# Patient Record
Sex: Female | Born: 1995 | Race: Black or African American | Hispanic: No | Marital: Single | Smoking: Never smoker
Health system: Southern US, Community
[De-identification: ages and names within clinical notes are randomized; demographics above are authoritative.]

## PROBLEM LIST (undated history)

## (undated) DIAGNOSIS — J020 Streptococcal pharyngitis: Secondary | ICD-10-CM

## (undated) DIAGNOSIS — Z91018 Allergy to other foods: Secondary | ICD-10-CM

## (undated) HISTORY — DX: Allergy to other foods: Z91.018

## (undated) HISTORY — PX: WISDOM TOOTH EXTRACTION: SHX21

---
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
## 2014-11-01 ENCOUNTER — Emergency Department (HOSPITAL_COMMUNITY)
Admission: EM | Admit: 2014-11-01 | Discharge: 2014-11-01 | Discharge status: Absent without leave | Payer: No Typology Code available for payment source

## 2015-01-09 ENCOUNTER — Emergency Department (HOSPITAL_COMMUNITY)
Admission: EM | Admit: 2015-01-09 | Discharge: 2015-01-09 | Disposition: A | Discharge status: Home / Self Care | Payer: Medicaid Other | Attending: Emergency Medicine | Admitting: Emergency Medicine

## 2015-01-09 ENCOUNTER — Encounter (HOSPITAL_COMMUNITY): Payer: Self-pay | Admitting: *Deleted

## 2015-01-09 DIAGNOSIS — Z8709 Personal history of other diseases of the respiratory system: Secondary | ICD-10-CM | POA: Diagnosis not present

## 2015-01-09 DIAGNOSIS — N390 Urinary tract infection, site not specified: Secondary | ICD-10-CM | POA: Diagnosis not present

## 2015-01-09 DIAGNOSIS — Z3202 Encounter for pregnancy test, result negative: Secondary | ICD-10-CM | POA: Insufficient documentation

## 2015-01-09 DIAGNOSIS — R1030 Lower abdominal pain, unspecified: Secondary | ICD-10-CM | POA: Diagnosis present

## 2015-01-09 LAB — URINE MICROSCOPIC-ADD ON

## 2015-01-09 LAB — URINALYSIS, ROUTINE W REFLEX MICROSCOPIC
Bilirubin Urine: NEGATIVE
Glucose, UA: NEGATIVE mg/dL
Ketones, ur: 15 mg/dL — AB
Nitrite: NEGATIVE
Protein, ur: NEGATIVE mg/dL
Specific Gravity, Urine: >1.03 — ABNORMAL HIGH (ref 1.005–1.030)
pH: 5.5 (ref 5.0–8.0)

## 2015-01-09 LAB — PREGNANCY, URINE: Preg Test, Ur: NEGATIVE

## 2015-01-09 MED ORDER — CEPHALEXIN 500 MG PO CAPS
500.0000 mg | ORAL_CAPSULE | Freq: Once | ORAL | Status: AC
  Administered 2015-01-09: 500 mg via ORAL
  Filled 2015-01-09: qty 1

## 2015-01-09 MED ORDER — CEPHALEXIN 500 MG PO CAPS: 500.0000 mg | ORAL_CAPSULE | Freq: Four times a day (QID) | ORAL | Status: DC

## 2015-01-09 MED ORDER — PHENAZOPYRIDINE HCL 100 MG PO TABS
200.0000 mg | ORAL_TABLET | Freq: Once | ORAL | Status: AC
  Administered 2015-01-09: 200 mg via ORAL
  Filled 2015-01-09: qty 2

## 2015-01-09 MED ORDER — PHENAZOPYRIDINE HCL 200 MG PO TABS: 200.0000 mg | ORAL_TABLET | Freq: Three times a day (TID) | ORAL | Status: DC | PRN

## 2015-01-09 NOTE — ED Provider Notes (Signed)
CSN: [id number]     Arrival date & time 01/09/15  0622 History   First MD Initiated Contact with Patient 01/09/15 [phone]     Chief Complaint  Patient presents with  . Abdominal Pain     (Consider location/radiation/quality/duration/timing/severity/associated sxs/prior Treatment) HPI Comments: Patient presents to the emergency department with complaints of lower abdominal pain since yesterday. She reports a dull ache in the area of her bladder that has been continuous but she has had significant pain with urination. Patient reports frequency and urgency but only passing small amounts of urine. She has not had any vaginal bleeding or discharge. There is no back pain or upper abdominal pain. She has not had any fever, nausea or vomiting.  Patient is a 19 y.o. female presenting with abdominal pain.  Abdominal Pain Associated symptoms: dysuria     Past Medical History  Diagnosis Date  . Strep throat    History reviewed. No pertinent past surgical history. No family history on file. Social History  Substance Use Topics  . Smoking status: Never Smoker   . Smokeless tobacco: None  . Alcohol Use: No   OB History    No data available     Review of Systems  Gastrointestinal: Positive for abdominal pain.  Genitourinary: Positive for dysuria, urgency, frequency and decreased urine volume.  All other systems reviewed and are negative.     Allergies  Peanuts  Home Medications   Prior to Admission medications   Medication Sig Start Date End Date Taking? Authorizing Provider  menthol-cetylpyridinium (CEPACOL) 3 MG lozenge Take 1 lozenge by mouth as needed for sore throat.    Historical Provider, MD  Pseudoephedrine-Acetaminophen (SINUS APAP NON-DROWSY PO) Take 2 tablets by mouth daily as needed. Patient has been taking off and on for a while as needed    Historical Provider, MD   BP 109/70 mmHg  Pulse 98  Temp(Src) 98 F (36.7 C) (Oral)  Resp 16  Ht 5\' 5"  (1.651 m)  Wt 130 lb  (58.968 kg)  BMI 21.63 kg/m2  SpO2 99%  LMP 12/15/2014 Physical Exam  Constitutional: She is oriented to person, place, and time. She appears well-developed and well-nourished. No distress.  HENT:  Head: Normocephalic and atraumatic.  Right Ear: Hearing normal.  Left Ear: Hearing normal.  Nose: Nose normal.  Mouth/Throat: Oropharynx is clear and moist and mucous membranes are normal.  Eyes: Conjunctivae and EOM are normal. Pupils are equal, round, and reactive to light.  Neck: Normal range of motion. Neck supple.  Cardiovascular: Regular rhythm, S1 normal and S2 normal.  Exam reveals no gallop and no friction rub.   No murmur heard. Pulmonary/Chest: Effort normal and breath sounds normal. No respiratory distress. She exhibits no tenderness.  Abdominal: Soft. Normal appearance and bowel sounds are normal. There is no hepatosplenomegaly. There is tenderness in the suprapubic area. There is no rebound, no guarding, no CVA tenderness, no tenderness at McBurney's point and negative Murphy's sign. No hernia.  Musculoskeletal: Normal range of motion.  Neurological: She is alert and oriented to person, place, and time. She has normal strength. No cranial nerve deficit or sensory deficit. Coordination normal. GCS eye subscore is 4. GCS verbal subscore is 5. GCS motor subscore is 6.  Skin: Skin is warm, dry and intact. No rash noted. No cyanosis.  Psychiatric: She has a normal mood and affect. Her speech is normal and behavior is normal. Thought content normal.  Nursing note and vitals reviewed.   ED Course  Procedures (including critical care time) Labs Review Labs Reviewed  URINALYSIS, ROUTINE W REFLEX MICROSCOPIC (NOT AT [facility]) - Abnormal; Notable for the following:    APPearance HAZY (*)    Specific Gravity, Urine >1.030 (*)    Hgb urine dipstick TRACE (*)    Ketones, ur 15 (*)    Leukocytes, UA SMALL (*)    All other components within normal limits  URINE MICROSCOPIC-ADD ON - Abnormal;  Notable for the following:    Squamous Epithelial / LPF TOO NUMEROUS TO COUNT (*)    Bacteria, UA MANY (*)    All other components within normal limits  PREGNANCY, URINE    Imaging Review No results found. I have personally reviewed and evaluated these images and lab results as part of my medical decision-making.   EKG Interpretation None      MDM   Final diagnoses:  None   urinary tract infection  Patient presents to the ER for evaluation of urinary frequency, urinary urgency, dysuria and lower abdominal pain since yesterday. She does not have any signs of pyelonephritis. Vital signs are normal. Urinalysis shows obvious infection. Patient will be treated for urinary tract infection with Keflex and Pyridium for symptomatic treatment. Return if symptoms worsen.    Gilda Crease, MD 01/09/15 [phone]

## 2015-01-09 NOTE — Discharge Instructions (Signed)

## 2015-01-09 NOTE — ED Notes (Signed)
Pt c/o lower abd pain and burning with urination that started yesterday,

## 2015-01-09 NOTE — ED Notes (Signed)
Pt states understanding of care given and follow up instructions 

## 2015-06-05 ENCOUNTER — Encounter (HOSPITAL_COMMUNITY): Payer: Self-pay | Admitting: Emergency Medicine

## 2015-06-05 ENCOUNTER — Emergency Department (HOSPITAL_COMMUNITY)
Admission: EM | Admit: 2015-06-05 | Discharge: 2015-06-05 | Disposition: A | Discharge status: Home / Self Care | Payer: No Typology Code available for payment source | Attending: Emergency Medicine | Admitting: Emergency Medicine

## 2015-06-05 DIAGNOSIS — M67449 Ganglion, unspecified hand: Secondary | ICD-10-CM

## 2015-06-05 DIAGNOSIS — L729 Follicular cyst of the skin and subcutaneous tissue, unspecified: Secondary | ICD-10-CM | POA: Insufficient documentation

## 2015-06-05 NOTE — Discharge Instructions (Signed)
We are unable to perform this excision in the Emergency department . Please follow up with a surgeon.  Excision of Skin Lesions Excision of a skin lesion refers to the removal of a section of skin by making small cuts (incisions) in the skin. This procedure may be done to remove a cancerous (malignant) or noncancerous (benign) growth on the skin. It is typically done to treat or prevent cancer or infection. It may also be done to improve cosmetic appearance. The procedure may be done to remove:  Cancerous growths, such as basal cell carcinoma, squamous cell carcinoma, or melanoma.  Noncancerous growths, such as a cyst or lipoma.  Growths, such as moles or skin tags, which may be removed for cosmetic reasons. Various excision or surgical techniques may be used depending on your condition, the location of the lesion, and your overall health. LET [facility] CARE PROVIDER KNOW ABOUT:  Any allergies you have.  All medicines you are taking, including vitamins, herbs, eye drops, creams, and over-the-counter medicines.  Previous problems you or members of your family have had with the use of anesthetics.  Any blood disorders you have.  Previous surgeries you have had.  Any medical conditions you have.  Whether you are pregnant or may be pregnant. RISKS AND COMPLICATIONS Generally, this is a safe procedure. However, problems may occur, including:  Bleeding.  Infection.  Scarring.  Recurrence of the cyst, lipoma, or cancer.  Changes in skin sensation or appearance, such as discoloration or swelling.  Reaction to the anesthetics.  Allergic reaction to surgical materials or ointments.  Damage to nerves, blood vessels, muscles, or other structures.  Continued pain. BEFORE THE PROCEDURE  Ask your health care provider about:  Changing or stopping your regular medicines. This is especially important if you are taking diabetes medicines or blood thinners.  Taking medicines such  as aspirin and ibuprofen. These medicines can thin your blood. Do not take these medicines before your procedure if your health care provider instructs you not to.  You may be asked to take certain medicines.  You may be asked to stop smoking.  You may have an exam or testing.  Plan to have someone take you home after the procedure.  Plan to have someone help you with activities during recovery. PROCEDURE  To reduce your risk of infection:  Your health care team will wash or sanitize their hands.  Your skin will be washed with soap.  You will be given a medicine to numb the area (local anesthetic).  One of the following excision techniques will be performed.  At the end of any of these procedures, antibiotic ointment will be applied as needed. Each of the following techniques may vary among health care providers and hospitals. Complete Surgical Excision The area of skin that needs to be removed will be marked with a pen. Using a small scalpel or scissors, the surgeon will gently cut around and under the lesion until it is completely removed. The lesion will be placed in a fluid and sent to the lab for examination. If necessary, bleeding will be controlled with a device that delivers heat (electrocautery). The edges of the wound may be stitched (sutured) together, and a bandage (dressing) will be applied. This procedure may be performed to treat a cancerous growth or a noncancerous cyst or lesion. Excision of a Cyst The surgeon will make an incision on the cyst. The entire cyst will be removed through the incision. The incision may be closed with sutures.  Shave Excision During shave excision, the surgeon will use a small blade or an electrically heated loop instrument to shave off the lesion. This may be done to remove a mole or a skin tag. The wound will usually be left to heal on its own without sutures. Punch Excision During punch excision, the surgeon will use a small tool that is  like a cookie cutter or a hole punch to cut a circle shape out of the skin. The outer edges of the skin will be sutured together. This may be done to remove a mole or a scar or to perform a biopsy of the lesion. Mohs Micrographic Surgery During Mohs micrographic surgery, layers of the lesion will be removed with a scalpel or a loop instrument and will be examined right away under a microscope. Layers will be removed until all of the abnormal or cancerous tissue has been removed. This procedure is minimally invasive, and it ensures the best cosmetic outcome. It involves the removal of as little normal tissue as possible. Mohs is usually done to treat skin cancer, such as basal cell carcinoma or squamous cell carcinoma, particularly on the face and ears. Depending on the size of the surgical wound, it may be sutured closed. AFTER THE PROCEDURE  Return to your normal activities as told by your health care provider.  Talk with your health care provider to discuss any test results, treatment options, and if necessary, the need for more tests.   This information is not intended to replace advice given to you by your health care provider. Make sure you discuss any questions you have with your health care provider.   Document Released: 03/24/2009 Document Revised: 09/18/2014 Document Reviewed: 02/13/2014 Elsevier Interactive Patient Education [employer]2016 [employer]

## 2015-06-05 NOTE — ED Notes (Signed)
Pt states her right middle finger has been swollen over the joint for about 3 weeks.  States it went down some, but has returned and is painful to touch.  Denies injury.

## 2015-06-05 NOTE — ED Provider Notes (Signed)
CSN: [id number]     Arrival date & time 06/05/15  0727 History   First MD Initiated Contact with Patient 06/05/15 [phone]     Chief Complaint  Patient presents with  . Finger Injury     (Consider location/radiation/quality/duration/timing/severity/associated sxs/prior Treatment) HPI  Christina Shah Is a [age] year old female who presents to the emergency department with chief complaint of mass on her right middle finger. She states that it has been present for some time. She denies severe pain, tenderness, heat or redness. She is unhappy with the cosmetic appearance. She states it does not affect her work or hand movement. She denies injury to the finger She has no other complaints at this time.  Past Medical History  Diagnosis Date  . Strep throat    History reviewed. No pertinent past surgical history. History reviewed. No pertinent family history. Social History  Substance Use Topics  . Smoking status: Never Smoker   . Smokeless tobacco: None  . Alcohol Use: No   OB History    No data available     Review of Systems  Constitutional: Negative for fever and chills.  Musculoskeletal: Negative for joint swelling.  Skin: Negative for rash.       Skin mass      Allergies  Peanuts  Home Medications   Prior to Admission medications   Medication Sig Start Date End Date Taking? Authorizing Provider  cephALEXin (KEFLEX) 500 MG capsule Take 1 capsule (500 mg total) by mouth 4 (four) times daily. 01/09/15   Gilda Creasehristopher J Pollina, MD  menthol-cetylpyridinium (CEPACOL) 3 MG lozenge Take 1 lozenge by mouth as needed for sore throat.    Historical Provider, MD  phenazopyridine (PYRIDIUM) 200 MG tablet Take 1 tablet (200 mg total) by mouth 3 (three) times daily as needed for pain. 01/09/15   Gilda Creasehristopher J Pollina, MD  Pseudoephedrine-Acetaminophen (SINUS APAP NON-DROWSY PO) Take 2 tablets by mouth daily as needed. Patient has been taking off and on for a while as needed    Historical  Provider, MD   BP 101/67 mmHg  Pulse 82  Temp(Src) 98.4 F (36.9 C) (Oral)  Resp 16  Ht 5\' 5"  (1.651 m)  Wt 61.236 kg  BMI 22.47 kg/m2  SpO2 100%  LMP 05/29/2015 Physical Exam  Constitutional: She is oriented to person, place, and time. She appears well-developed and well-nourished. No distress.  HENT:  Head: Normocephalic and atraumatic.  Eyes: Conjunctivae are normal. No scleral icterus.  Neck: Normal range of motion.  Cardiovascular: Normal rate, regular rhythm and normal heart sounds.  Exam reveals no gallop and no friction rub.   No murmur heard. Pulmonary/Chest: Effort normal and breath sounds normal. No respiratory distress.  Abdominal: Soft. Bowel sounds are normal. She exhibits no distension and no mass. There is no tenderness. There is no guarding.  Musculoskeletal:   Full range of motion of the fingers, full strength with movement of each joint of the hand.   Neurological: She is alert and oriented to person, place, and time.  Skin: Skin is warm and dry. She is not diaphoretic.  1 cm soft, movable mass over the right middle PIP joint. Superficial.  Nursing note and vitals reviewed.   ED Course  Procedures (including critical care time) Labs Review Labs Reviewed - No data to display  Imaging Review No results found. I have personally reviewed and evaluated these images and lab results as part of my medical decision-making.   EKG Interpretation None  MDM   Final diagnoses:  Mucous cyst of finger    This appears to be mucous cyst of the finger. Patient would like it removed, however, I feel that as it is not infected or causing her significant pain, the risk of excision outweighs its presence in her finger. Have given the patient referral to a hand surgeon for excision. Discussed return precautions. She appears safe for discharge at this time.    Arthor Captain, PA-C 06/05/15 0981  Donnetta Hutching, MD 06/05/15 [phone]

## 2015-06-13 ENCOUNTER — Encounter (HOSPITAL_COMMUNITY): Payer: Self-pay | Admitting: *Deleted

## 2015-06-13 ENCOUNTER — Emergency Department (HOSPITAL_COMMUNITY)
Admission: EM | Admit: 2015-06-13 | Discharge: 2015-06-13 | Disposition: A | Discharge status: Home / Self Care | Payer: No Typology Code available for payment source | Attending: Emergency Medicine | Admitting: Emergency Medicine

## 2015-06-13 DIAGNOSIS — Y999 Unspecified external cause status: Secondary | ICD-10-CM | POA: Insufficient documentation

## 2015-06-13 DIAGNOSIS — Y929 Unspecified place or not applicable: Secondary | ICD-10-CM | POA: Insufficient documentation

## 2015-06-13 DIAGNOSIS — S61219A Laceration without foreign body of unspecified finger without damage to nail, initial encounter: Secondary | ICD-10-CM

## 2015-06-13 DIAGNOSIS — Y9389 Activity, other specified: Secondary | ICD-10-CM | POA: Insufficient documentation

## 2015-06-13 DIAGNOSIS — W228XXA Striking against or struck by other objects, initial encounter: Secondary | ICD-10-CM | POA: Insufficient documentation

## 2015-06-13 DIAGNOSIS — S61312A Laceration without foreign body of right middle finger with damage to nail, initial encounter: Secondary | ICD-10-CM | POA: Insufficient documentation

## 2015-06-13 MED ORDER — CEPHALEXIN 500 MG PO CAPS: 500.0000 mg | ORAL_CAPSULE | Freq: Two times a day (BID) | ORAL | Status: DC

## 2015-06-13 NOTE — ED Notes (Signed)
Pt c/o pain and bleeding to right middle finger; pt states she stabbed it with belt; some bleeding noted at this time

## 2015-06-13 NOTE — ED Provider Notes (Signed)
CSN: [id number]     Arrival date & time 06/13/15  0048 History   First MD Initiated Contact with Patient 06/13/15 0132     Chief Complaint  Patient presents with  . Extremity Laceration     (Consider location/radiation/quality/duration/timing/severity/associated sxs/prior Treatment) The history is provided by the patient. No language interpreter was used.   Christina Shah is a 20 y.o. female who presents to the ED with an injury to the right middle finger. Patient states that she stabbed the finger while trying to put a hole in a belt. She is scheduled to see a hand doctor about the same finger due to a growth on the finger. She denies any other problems.  Past Medical History  Diagnosis Date  . Strep throat    History reviewed. No pertinent past surgical history. History reviewed. No pertinent family history. Social History  Substance Use Topics  . Smoking status: Never Smoker   . Smokeless tobacco: None  . Alcohol Use: No   OB History    No data available     Review of Systems Negative except as stated in HPI   Allergies  Peanuts  Home Medications   Prior to Admission medications   Medication Sig Start Date End Date Taking? Authorizing Provider  cephALEXin (KEFLEX) 500 MG capsule Take 1 capsule (500 mg total) by mouth 4 (four) times daily. 01/09/15   Gilda Creasehristopher J Pollina, MD  menthol-cetylpyridinium (CEPACOL) 3 MG lozenge Take 1 lozenge by mouth as needed for sore throat.    Historical Provider, MD  phenazopyridine (PYRIDIUM) 200 MG tablet Take 1 tablet (200 mg total) by mouth 3 (three) times daily as needed for pain. 01/09/15   Gilda Creasehristopher J Pollina, MD  Pseudoephedrine-Acetaminophen (SINUS APAP NON-DROWSY PO) Take 2 tablets by mouth daily as needed. Patient has been taking off and on for a while as needed    Historical Provider, MD   BP 109/72 mmHg  Pulse 95  Temp(Src) 98.5 F (36.9 C) (Oral)  Resp 18  Ht 5\' 5"  (1.651 m)  Wt 60.782 kg  BMI 22.30 kg/m2  SpO2  97%  LMP 05/29/2015 Physical Exam  Constitutional: She is oriented to person, place, and time. She appears well-developed and well-nourished.  Eyes: EOM are normal.  Neck: Neck supple.  Cardiovascular: Normal rate.   Pulmonary/Chest: Effort normal.  Abdominal: Soft.  Musculoskeletal: Normal range of motion.       Right hand: She exhibits laceration. She exhibits normal range of motion and normal capillary refill. Normal sensation noted. Normal strength noted.  Superficial laceration to the right middle finger.   Neurological: She is alert and oriented to person, place, and time. No cranial nerve deficit.  Skin: Skin is warm and dry.  Laceration finger  Psychiatric: She has a normal mood and affect. Her behavior is normal.  Nursing note and vitals reviewed.   ED Course  Procedures (including critical care time) Finger soaked in NSS with antibacterial soap  Scrubbed with sterile gauze and irrigated Closed with steri strips and then with Dermabond Up to date on tetanus Rx Keflex  Labs Review Labs Reviewed - No data to display   MDM  20 y.o. female with superficial laceration to the right middle finger stable for d/c without focal neuro deficits. Discussed with the patient plan of careand all questioned fully answered. She will return if any problems arise.   Final diagnoses:  Laceration of finger, initial encounter        [facility] Hospital[facility] M Lindbergh Winkles,  NP 06/13/15 0217  Benjiman Core, MD 06/13/15 [phone]

## 2017-04-13 ENCOUNTER — Other Ambulatory Visit: Payer: Self-pay

## 2017-04-13 ENCOUNTER — Other Ambulatory Visit (HOSPITAL_COMMUNITY)
Admission: RE | Admit: 2017-04-13 | Discharge: 2017-04-13 | Disposition: A | Discharge status: Home / Self Care | Payer: BLUE CROSS/BLUE SHIELD | Source: Ambulatory Visit | Attending: Family Medicine | Admitting: Family Medicine

## 2017-04-13 ENCOUNTER — Ambulatory Visit: Payer: BLUE CROSS/BLUE SHIELD | Admitting: Family Medicine

## 2017-04-13 ENCOUNTER — Encounter: Payer: Self-pay | Admitting: Family Medicine

## 2017-04-13 VITALS — BP 112/82 | HR 95 | Temp 98.0°F | Resp 16 | Ht 65.0 in | Wt 164.0 lb

## 2017-04-13 DIAGNOSIS — M545 Low back pain, unspecified: Secondary | ICD-10-CM

## 2017-04-13 DIAGNOSIS — Z91018 Allergy to other foods: Secondary | ICD-10-CM | POA: Diagnosis not present

## 2017-04-13 DIAGNOSIS — R5383 Other fatigue: Secondary | ICD-10-CM | POA: Diagnosis not present

## 2017-04-13 DIAGNOSIS — Z113 Encounter for screening for infections with a predominantly sexual mode of transmission: Secondary | ICD-10-CM | POA: Diagnosis not present

## 2017-04-13 DIAGNOSIS — Z0000 Encounter for general adult medical examination without abnormal findings: Secondary | ICD-10-CM

## 2017-04-13 DIAGNOSIS — N898 Other specified noninflammatory disorders of vagina: Secondary | ICD-10-CM

## 2017-04-13 DIAGNOSIS — R635 Abnormal weight gain: Secondary | ICD-10-CM | POA: Diagnosis not present

## 2017-04-13 MED ORDER — EPINEPHRINE 0.3 MG/0.3ML IJ SOAJ: 0.3000 mg | INTRAMUSCULAR | 1 refills | Status: DC | PRN

## 2017-04-13 NOTE — Progress Notes (Signed)
Patient ID: Christina Shah, female    DOB: 11-Nov-1995, 22 y.o.   MRN: [id number]  Chief Complaint  Patient presents with  . Establish Care    wants CPE  . Back Pain    states she has a 'catch'  . Shoulder Pain  . Menstrual Problem    Allergies Pineapple and Peanuts [peanut oil]  Subjective:   Christina Shah is a 22 y.o. female who presents to [facility] today.  HPI Here to establish care as a new patient visit.Marland Kitchen Has not had a PCP she has previously been seen sporadically at the health department in [place], [place].   In reviewing her past medical history, she does report a history of peanut allergy and pineapple.  She does report that last year ate a piece of fresh pineapple and tongue swelled, neck/throat tightness and mouth swelled. Took several benadryl tablets and symptoms got better.  She has not been eating pineapple since this time other than that which comes out of a can. She still does eat apples. No allergy evaluation. No epi pen.   Reports that for the past several months that it is uncomfortable when she has sex.  Reports that she has this whether or not she uses condoms with sexual intercourse.  Denies pain with sex but reports that it feels as if it is rubbing and bothers her. She reports that it does not feel as good as it did in the past. She has not been checked for any STD since that time but that she has been checked in the past. She does report that her last Pap smear  Was abnormal. Reports that last year she was told by the HD that had some abnormal cells and needed to go back for pap smear. She never followed up. She is unsure if she has had the gardisil vaccine. Reports that she used to be on birth control but reports that they made her feel bad. Depo provera caused problems in back and does not want to be on that. Reports that she uses condoms for birth control. Reports that she gained over 30 pounds while on birth control pills and is not  interested in taking them. She reports that her weight gain came on fast and that she has had to buy new jeans and underwear due to the weight gain. Energy is pretty good but can be low at time.   She reports that she does have some vaginal discharge but believes that some normal discharge.  Denies any vaginal odor or pain.  Reports that her periods can be somewhat irregular.  Has started trying to track at [facility] fine.  Reports that sometimes they are at the beginning of the month and sometimes they are at the end of the month.  Reports that it can last from 5-9 days.  She wears tampons.  She does not sleep in them.  She reports that the periods are not heavy.  Does not want to be placed on any form of birth control.  She also reports that she has had some muscular back pain in lower, right side of her back, started in 11/2016.  She correlates this to having received Depo shot many years ago.  She reports that she knows her posture is not good.  She does do a lot of lifting and active movement at work.  She denies any associated numbness or tingling in her extremities.  No weakness.  Reports that she does  not have the pain now.  She reports that it bothers her from time to time.  The pain does not wake her up from sleep.  She has not had any weight loss.  No night sweats.  No bony pain.  Pain occurs mainly in the muscle of her low back.  Urinating fine.  No urinary frequency, urgency, or hesitancy.   Past Medical History:  Diagnosis Date  . Food allergy   . Strep throat     Past Surgical History:  Procedure Laterality Date  . WISDOM TOOTH EXTRACTION      Family History  Problem Relation Age of Onset  . Hypertension Mother   . Hypertension Father   . Diabetes Maternal Grandmother      Social History   Socioeconomic History  . Marital status: Single    Spouse name: Not on file  . Number of children: Not on file  . Years of education: Not on file  . Highest education level: Not on file   Occupational History  . Not on file  Social Needs  . Financial resource strain: Not on file  . Food insecurity:    Worry: Not on file    Inability: Not on file  . Transportation needs:    Medical: Not on file    Non-medical: Not on file  Tobacco Use  . Smoking status: Former Smoker    Years: 0.50    Types: Cigarettes  . Smokeless tobacco: Never Used  Substance and Sexual Activity  . Alcohol use: Yes    Alcohol/week: 1.2 oz    Types: 2 Glasses of wine per week  . Drug use: No    Frequency: 1.0 times per week  . Sexual activity: Yes    Birth control/protection: Condom  Lifestyle  . Physical activity:    Days per week: Not on file    Minutes per session: Not on file  . Stress: Not on file  Relationships  . Social connections:    Talks on phone: Not on file    Gets together: Not on file    Attends religious service: Not on file    Active member of club or organization: Not on file    Attends meetings of clubs or organizations: Not on file    Relationship status: Not on file  Other Topics Concern  . Not on file  Social History Narrative   Eats all food groups. Eats apples.    Wear seatbelt.   Exercise.   Works at [employer]. Does not like job. Has been there for a long tie.    Would like to be a dental hygenist.   Live with parents.     Review of Systems  Constitutional: Positive for unexpected weight change. Negative for activity change, appetite change and fever.       Reports that has gained a lot weight recently but wonders if related to OCP use.   HENT: Negative for mouth sores, postnasal drip and trouble swallowing.   Eyes: Negative for visual disturbance.  Respiratory: Negative for cough, chest tightness and shortness of breath.   Cardiovascular: Negative for chest pain, palpitations and leg swelling.  Gastrointestinal: Negative for abdominal pain, nausea and vomiting.  Endocrine: Negative for polyphagia and polyuria.  Genitourinary: Negative for  dysuria, frequency, hematuria, menstrual problem, urgency, vaginal discharge and vaginal pain.       Patient thinks that menses are irregular. Does not keep track of menses. May last 3-9 days. Can be heavy. Wears  tampons. Does not sleep in tampons. No Post coital bleeding.  She reports her periods do not bother her.  Musculoskeletal:       She reports that she occasionally has some left-sided shoulder pain but not now.  She reports it bothers her if she uses her shoulder a lot.  No known trauma.  Skin: Negative for pallor and rash.  Neurological: Negative for dizziness, syncope and light-headedness.  Hematological: Negative for adenopathy.  Psychiatric/Behavioral: Negative for agitation, behavioral problems, dysphoric mood, hallucinations, sleep disturbance and suicidal ideas. The patient is not nervous/anxious and is not hyperactive.      Objective:   BP 112/82 (BP Location: Left Arm, Patient Position: Sitting, Cuff Size: Normal)   Pulse 95   Temp 98 F (36.7 C) (Temporal)   Resp 16   Ht 5\' 5"  (1.651 m)   Wt 164 lb (74.4 kg)   LMP 03/31/2017   SpO2 99%   BMI 27.29 kg/m   Physical Exam  Constitutional: She is oriented to person, place, and time. She appears well-developed and well-nourished. No distress.  HENT:  Head: Normocephalic and atraumatic.  Eyes: Pupils are equal, round, and reactive to light.  Neck: Normal range of motion. Neck supple. No thyromegaly present.  Cardiovascular: Normal rate, regular rhythm and normal heart sounds.  Pulmonary/Chest: Effort normal and breath sounds normal. No respiratory distress.  Abdominal: Soft. Bowel sounds are normal.  Musculoskeletal: Normal range of motion. She exhibits no edema, tenderness or deformity.       Lumbar back: Normal. She exhibits normal range of motion, no bony tenderness, no swelling and no deformity.  Neurological: She is alert and oriented to person, place, and time. She has normal strength. She is not disoriented. She  displays no atrophy, no tremor and normal reflexes. No cranial nerve deficit or sensory deficit. She exhibits normal muscle tone. Coordination and gait normal.  Reflex Scores:      Patellar reflexes are 2+ on the right side and 2+ on the left side. Skin: Skin is warm and dry. No rash noted.  Psychiatric: She has a normal mood and affect. Her behavior is normal. Judgment and thought content normal.  Nursing note and vitals reviewed.    Assessment and Plan  1. Vaginal discharge Pelvic deferred today.  Patient will return to clinic for Pap, pelvic, clinical breast exam.  We will perform physical exam at that time.  At this time will request records from health department.  Did discuss that vaginal irritation that she is feeling during intercourse could be secondary to sexually transmitted infection.  Will screen for that at this time.  Safe sex and condom use recommended.  Patient defers OCP use at this time.  We will discuss birth control options at next office visit. - Urine cytology ancillary only  2. Weight gain Possibly secondary to previous hormonal contraception.  Possibly secondary to diet.  Patient is committing to improving her diet.  She does not want to gain additional weight at this time.  We did discuss that her BMI is consistent with being overweight.  Diet, exercise, and weight loss recommendations discussed.  Will check a TSH due to the rapid weight gain.  3. Back pain at L4-L5 level Discussed with patient that I believe her pain is muscular in etiology.  This could be a result of her poor posture and decreased core strength.  Exercises and strengthening recommended.  She was counseled concerning worrisome signs of back pain and if those develop she should  follow-up or seek medical advice.  She voiced understanding  4. Screen for STD (sexually transmitted disease) Screening performed - Hepatitis panel, acute - HIV antibody - RPR  5. Fatigue, unspecified type Check labs  today. - CBC with Differential/Platelet - TSH - COMPLETE METABOLIC PANEL WITH GFR  6. Well adult exam Patient is to return for complete physical exam. - Lipid panel  7. Food allergy Patient with history of significant allergic reaction with signs and symptoms of possible anaphylaxis.  I do believe this warrants workup by allergy at this time.  We did discuss anaphylaxis in the office.  We discussed late phase versus early phase of anaphylaxis.  If symptoms do occur after ingestion of allergen she should use her EpiPen if she has symptoms of respiratory compromise.  We discussed symptoms such as wheezing, chest tightness, oral cavity edema, difficulty breathing or difficulty swallowing.  She was counseled on how to use the EpiPen today.  She was also told to use Benadryl if symptoms occur or if she does ever use her EpiPen she should go to the emergency department for evaluation.  She voiced understanding. - Ambulatory referral to Allergy - EPINEPHrine 0.3 mg/0.3 mL IJ SOAJ injection; Inject 0.3 mLs (0.3 mg total) into the muscle as needed.  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 - Ambulatory referral to Allergy Patient will follow-up for CPE in 1 month.  Records requested today.  She will call with any questions or concerns.  Office visit was 45 minutes.  Greater than 50% of office visit spent counseling and coordinating care. Return in about 1 month (around 05/11/2017) for CPE with pap/pelvic. Aliene Beams, MD 04/13/2017

## 2017-04-14 ENCOUNTER — Telehealth: Payer: Self-pay | Admitting: Family Medicine

## 2017-04-14 LAB — HIV ANTIBODY (ROUTINE TESTING W REFLEX): HIV 1&2 Ab, 4th Generation: NONREACTIVE

## 2017-04-14 LAB — COMPLETE METABOLIC PANEL WITH GFR
AG Ratio: 1.6 (calc) (ref 1.0–2.5)
ALT: 9 U/L (ref 6–29)
AST: 19 U/L (ref 10–30)
Albumin: 4.6 g/dL (ref 3.6–5.1)
Alkaline phosphatase (APISO): 61 U/L (ref 33–115)
BUN: 9 mg/dL (ref 7–25)
CO2: 27 mmol/L (ref 20–32)
Calcium: 9.6 mg/dL (ref 8.6–10.2)
Chloride: 104 mmol/L (ref 98–110)
Creat: 0.65 mg/dL (ref 0.50–1.10)
GFR, Est African American: 146 mL/min/{1.73_m2} (ref >=60)
GFR, Est Non African American: 126 mL/min/{1.73_m2} (ref >=60)
Globulin: 2.9 g/dL (calc) (ref 1.9–3.7)
Glucose, Bld: 81 mg/dL (ref 65–139)
Potassium: 3.9 mmol/L (ref 3.5–5.3)
Sodium: 138 mmol/L (ref 135–146)
Total Bilirubin: 0.6 mg/dL (ref 0.2–1.2)
Total Protein: 7.5 g/dL (ref 6.1–8.1)

## 2017-04-14 LAB — LIPID PANEL
Cholesterol: 165 mg/dL (ref <200)
HDL: 87 mg/dL (ref >50)
LDL Cholesterol (Calc): 65 mg/dL (calc)
Non-HDL Cholesterol (Calc): 78 mg/dL (calc) (ref <130)
Total CHOL/HDL Ratio: 1.9 (calc) (ref <5.0)
Triglycerides: 44 mg/dL (ref <150)

## 2017-04-14 LAB — HEPATITIS PANEL, ACUTE
Hep A IgM: NONREACTIVE
Hep B C IgM: NONREACTIVE
Hepatitis B Surface Ag: NONREACTIVE
Hepatitis C Ab: NONREACTIVE
SIGNAL TO CUT-OFF: 0.05 (ref <1.00)

## 2017-04-14 LAB — CBC WITH DIFFERENTIAL/PLATELET
Basophils Absolute: 52 cells/uL (ref 0–200)
Basophils Relative: 0.9 %
Eosinophils Absolute: 110 cells/uL (ref 15–500)
Eosinophils Relative: 1.9 %
HCT: 39.6 % (ref 35.0–45.0)
Hemoglobin: 13.2 g/dL (ref 11.7–15.5)
Lymphs Abs: 2535 cells/uL (ref 850–3900)
MCH: 26.9 pg — ABNORMAL LOW (ref 27.0–33.0)
MCHC: 33.3 g/dL (ref 32.0–36.0)
MCV: 80.8 fL (ref 80.0–100.0)
MPV: 10.3 fL (ref 7.5–12.5)
Monocytes Relative: 6.9 %
Neutro Abs: 2703 cells/uL (ref 1500–7800)
Neutrophils Relative %: 46.6 %
Platelets: 294 10*3/uL (ref 140–400)
RBC: 4.9 10*6/uL (ref 3.80–5.10)
RDW: 12.4 % (ref 11.0–15.0)
Total Lymphocyte: 43.7 %
WBC mixed population: 400 cells/uL (ref 200–950)
WBC: 5.8 10*3/uL (ref 3.8–10.8)

## 2017-04-14 LAB — RPR: RPR Ser Ql: NONREACTIVE

## 2017-04-14 LAB — TSH: TSH: 0.83 mIU/L

## 2017-04-14 NOTE — Telephone Encounter (Signed)
Pt needs to come in for repeat urin.  The lab does not have her urin.  Labeled cup in the clinic area.

## 2017-04-15 NOTE — Telephone Encounter (Signed)
Talked with patient this am and she will come by today. 04/15/17

## 2017-04-18 LAB — URINE CYTOLOGY ANCILLARY ONLY
Chlamydia: POSITIVE — AB
Neisseria Gonorrhea: NEGATIVE
Trichomonas: NEGATIVE

## 2017-04-19 LAB — URINE CYTOLOGY ANCILLARY ONLY
Bacterial vaginitis: NEGATIVE
Candida vaginitis: NEGATIVE

## 2017-04-20 ENCOUNTER — Telehealth: Payer: Self-pay | Admitting: Family Medicine

## 2017-04-20 MED ORDER — AZITHROMYCIN 500 MG PO TABS: 1000.0000 mg | ORAL_TABLET | Freq: Once | ORAL | 0 refills | Status: AC

## 2017-04-20 NOTE — Telephone Encounter (Signed)
Please call and advise that patient has tested positive for Chlamydia.Advise that will need to take the medication that was called in as directed. Advise of medication and dose.  Advise that this is a sexually transmitted disease caused by Chlamydia trachomatis infection. It can cause  a cervicitis in women and a urethritis and proctitis in women and men. Advise that it is transmitted through sexual contact wit the penis, vagina, mouth, or anus of an infected partner. This can be cured with antibiotics. Advise that they should abstain from sexual activity for seven days after completion of antibiotics to prevent spreading the infection. It is important to take all the medication. Do not share the medication with others. If symptoms persists for more than 2 days after receiving treatment, the should follow up for reevaluation. Patient needs to tell all recent anal, vaginal, or oral sex partners (people within the past 60 days) so that they can be seen by a health care provider and get treatment. Please advise that patient will need to follow up to be retested for chlamydia about three months after treatment of this infection. This is very important to prevent long term complications of untreated infections. Advise that latex female condoms, when used consistently and correctly, can reduce the risk of getting or giving chlamydia. The surest way to avoid chlamydia is to abstain from vaginal, anal, and oral sex, or to be in a long-term mutually monogamous relationship with a partner who has been tested and is known to be uninfected. Please let me know if they have any other questions.    [facility] Please advise patient to keep her scheduled follow-up to review her labs.  And advised her that we will need to repeat her chlamydia test at her follow-up.

## 2017-04-20 NOTE — Telephone Encounter (Signed)
Patient informed of message below, verbalized understanding.  

## 2017-04-20 NOTE — Telephone Encounter (Signed)
Tried to contact patient, whomever answered the phone was not able to tell me if patient was available to talk. Will try again.

## 2017-05-11 ENCOUNTER — Encounter: Payer: Self-pay | Admitting: Family Medicine

## 2017-05-17 ENCOUNTER — Telehealth: Payer: Self-pay | Admitting: Family Medicine

## 2017-05-17 ENCOUNTER — Encounter: Payer: Self-pay | Admitting: Family Medicine

## 2017-05-17 NOTE — Telephone Encounter (Signed)
Left voicemail that patient needed to call / reschedule appt.

## 2017-06-07 ENCOUNTER — Telehealth: Payer: Self-pay | Admitting: Family Medicine

## 2017-06-07 NOTE — Telephone Encounter (Signed)
2nd attempt to reach patient and reschedule appt for 06/09/17

## 2017-06-09 ENCOUNTER — Encounter: Payer: Self-pay | Admitting: Family Medicine

## 2017-06-17 ENCOUNTER — Encounter: Payer: Self-pay | Admitting: Family Medicine

## 2017-06-23 ENCOUNTER — Other Ambulatory Visit: Payer: Self-pay

## 2017-06-23 ENCOUNTER — Encounter (HOSPITAL_COMMUNITY): Payer: Self-pay | Admitting: Emergency Medicine

## 2017-06-23 ENCOUNTER — Emergency Department (HOSPITAL_COMMUNITY)
Admission: EM | Admit: 2017-06-23 | Discharge: 2017-06-23 | Disposition: A | Discharge status: Home / Self Care | Payer: BLUE CROSS/BLUE SHIELD | Attending: Emergency Medicine | Admitting: Emergency Medicine

## 2017-06-23 DIAGNOSIS — Z91010 Allergy to peanuts: Secondary | ICD-10-CM | POA: Insufficient documentation

## 2017-06-23 DIAGNOSIS — Z79899 Other long term (current) drug therapy: Secondary | ICD-10-CM | POA: Insufficient documentation

## 2017-06-23 DIAGNOSIS — Z711 Person with feared health complaint in whom no diagnosis is made: Secondary | ICD-10-CM

## 2017-06-23 DIAGNOSIS — Z87891 Personal history of nicotine dependence: Secondary | ICD-10-CM | POA: Insufficient documentation

## 2017-06-23 DIAGNOSIS — N898 Other specified noninflammatory disorders of vagina: Secondary | ICD-10-CM | POA: Insufficient documentation

## 2017-06-23 LAB — URINALYSIS, ROUTINE W REFLEX MICROSCOPIC
Bilirubin Urine: NEGATIVE
Glucose, UA: NEGATIVE mg/dL
Hgb urine dipstick: NEGATIVE
Ketones, ur: NEGATIVE mg/dL
Nitrite: NEGATIVE
Protein, ur: NEGATIVE mg/dL
Specific Gravity, Urine: 1.024 (ref 1.005–1.030)
pH: 6 (ref 5.0–8.0)

## 2017-06-23 LAB — WET PREP, GENITAL
Clue Cells Wet Prep HPF POC: NONE SEEN
Sperm: NONE SEEN
Trich, Wet Prep: NONE SEEN
Yeast Wet Prep HPF POC: NONE SEEN

## 2017-06-23 LAB — PREGNANCY, URINE: Preg Test, Ur: NEGATIVE

## 2017-06-23 MED ORDER — AZITHROMYCIN 250 MG PO TABS
1000.0000 mg | ORAL_TABLET | Freq: Once | ORAL | Status: AC
  Administered 2017-06-23: 1000 mg via ORAL
  Filled 2017-06-23: qty 4

## 2017-06-23 MED ORDER — LIDOCAINE HCL (PF) 1 % IJ SOLN
INTRAMUSCULAR | Status: AC
  Administered 2017-06-23: 2 mL
  Filled 2017-06-23: qty 2

## 2017-06-23 MED ORDER — CEFTRIAXONE SODIUM 250 MG IJ SOLR
250.0000 mg | Freq: Once | INTRAMUSCULAR | Status: AC
  Administered 2017-06-23: 250 mg via INTRAMUSCULAR
  Filled 2017-06-23: qty 250

## 2017-06-23 NOTE — ED Triage Notes (Signed)
Patient c/o dysuria that started yesterday. Patient also reports discharge and itching that started as well in which patient used over-the-counter monostat for possible yeast infection.

## 2017-06-23 NOTE — Discharge Instructions (Signed)
We will contact you with the results of your remaining lab work when it is available. °

## 2017-06-23 NOTE — ED Provider Notes (Signed)
[facility] EMERGENCY DEPARTMENT Provider Note   CSN: [id number] Arrival date & time: 06/23/17  1433     History   Chief Complaint Chief Complaint  Patient presents with  . Dysuria    HPI Christina Shah is a 22 y.o. female who presents to ED for evaluation of 2-day history of dysuria, vaginal irritation and discharge.  She initially thought her symptoms were due to a yeast infection but has no improvement with over-the-counter Monistat.  She states that symptoms feel somewhat similar to a UTI that she had several months ago.  She denies any abdominal pain, back pain, fever, hematuria, abnormal vaginal bleeding, bowel changes.  She cannot confidently say that she does not have an STD.  HPI  Past Medical History:  Diagnosis Date  . Food allergy   . Strep throat     There are no active problems to display for this patient.   Past Surgical History:  Procedure Laterality Date  . WISDOM TOOTH EXTRACTION       OB History    Gravida  0   Para  0   Term  0   Preterm  0   AB  0   Living  0     SAB  0   TAB  0   Ectopic  0   Multiple  0   Live Births  0            Home Medications    Prior to Admission medications   Medication Sig Start Date End Date Taking? Authorizing Provider  COLLAGEN PO Take by mouth.    [provider]  EPINEPHrine 0.3 mg/0.3 mL IJ SOAJ injection Inject 0.3 mLs (0.3 mg total) into the muscle as needed. 04/13/17   Aliene BeamsHagler, Rachel, MD  Multiple Vitamins-Calcium (ONE-A-DAY WOMENS FORMULA PO) Take by mouth.    [provider]    Family History Family History  Problem Relation Age of Onset  . Hypertension Mother   . Hypertension Father   . Diabetes Maternal Grandmother     Social History Social History   Tobacco Use  . Smoking status: Former Smoker    Years: 0.50    Types: Cigarettes  . Smokeless tobacco: Never Used  Substance Use Topics  . Alcohol use: Yes    Alcohol/week: 1.2 oz    Types: 2 Glasses  of wine per week  . Drug use: No     Allergies   Pineapple and Peanuts [peanut oil]   Review of Systems Review of Systems  Constitutional: Negative for appetite change, chills and fever.  HENT: Negative for ear pain, rhinorrhea, sneezing and sore throat.   Eyes: Negative for photophobia and visual disturbance.  Respiratory: Negative for cough, chest tightness, shortness of breath and wheezing.   Cardiovascular: Negative for chest pain and palpitations.  Gastrointestinal: Negative for abdominal pain, blood in stool, constipation, diarrhea, nausea and vomiting.  Genitourinary: Positive for dysuria and vaginal discharge. Negative for hematuria, urgency, vaginal bleeding and vaginal pain.  Musculoskeletal: Negative for myalgias.  Skin: Negative for rash.  Neurological: Negative for dizziness, weakness and light-headedness.     Physical Exam Updated Vital Signs BP 114/71 (BP Location: Right Arm)   Pulse 93   Temp 98.5 F (36.9 C) (Oral)   Resp 14   Ht 5\' 5"  (1.651 m)   Wt 72.6 kg (160 lb)   LMP 05/30/2017   SpO2 100%   BMI 26.63 kg/m   Physical Exam  Constitutional: She appears  well-developed and well-nourished. No distress.  HENT:  Head: Normocephalic and atraumatic.  Nose: Nose normal.  Eyes: Conjunctivae and EOM are normal. Left eye exhibits no discharge. No scleral icterus.  Neck: Normal range of motion. Neck supple.  Cardiovascular: Normal rate, regular rhythm, normal heart sounds and intact distal pulses. Exam reveals no gallop and no friction rub.  No murmur heard. Pulmonary/Chest: Effort normal and breath sounds normal. No respiratory distress.  Abdominal: Soft. Bowel sounds are normal. She exhibits no distension. There is no tenderness. There is no guarding.  Genitourinary: There is no rash on the right labia. There is no rash on the left labia. Cervix exhibits no motion tenderness. Right adnexum displays no mass and no tenderness. Left adnexum displays no mass  and no tenderness. No foreign body in the vagina. Vaginal discharge found.  Genitourinary Comments: Pelvic exam: normal external genitalia without evidence of trauma. VULVA: normal appearing vulva with no masses, tenderness or lesion. VAGINA: normal appearing vagina with copious amount of thick white discharge; no lesions. CERVIX: normal appearing cervix without lesions, cervical motion tenderness absent, cervical os closed with out purulent discharge; No vaginal discharge. Wet prep and DNA probe for chlamydia and GC obtained.   ADNEXA: normal adnexa in size, nontender and no masses UTERUS: uterus is normal size, shape, consistency and nontender.    Musculoskeletal: Normal range of motion. She exhibits no edema.  Neurological: She is alert. She exhibits normal muscle tone. Coordination normal.  Skin: Skin is warm and dry. No rash noted.  Psychiatric: She has a normal mood and affect.  Nursing note and vitals reviewed.    ED Treatments / Results  Labs (all labs ordered are listed, but only abnormal results are displayed) Labs Reviewed  WET PREP, GENITAL - Abnormal; Notable for the following components:      Result Value   WBC, Wet Prep HPF POC MANY (*)    All other components within normal limits  URINALYSIS, ROUTINE W REFLEX MICROSCOPIC - Abnormal; Notable for the following components:   APPearance HAZY (*)    Leukocytes, UA LARGE (*)    Bacteria, UA RARE (*)    All other components within normal limits  PREGNANCY, URINE  HIV ANTIBODY (ROUTINE TESTING)  RPR  GC/CHLAMYDIA PROBE AMP ([place]) NOT AT [facility]    EKG None  Radiology No results found.  Procedures Procedures (including critical care time)  Medications Ordered in ED Medications  cefTRIAXone (ROCEPHIN) injection 250 mg (250 mg Intramuscular Given 06/23/17 1653)  azithromycin (ZITHROMAX) tablet 1,000 mg (1,000 mg Oral Given 06/23/17 1653)  lidocaine (PF) (XYLOCAINE) 1 % injection (2 mLs  Given 06/23/17 1653)      Initial Impression / Assessment and Plan / ED Course  I have reviewed the triage vital signs and the nursing notes.  Pertinent labs & imaging results that were available during my care of the patient were reviewed by me and considered in my medical decision making (see chart for details).     22 year old female presents to ED for evaluation of 2-day history of dysuria, vaginal irritation and discharge.  Symptoms feel somewhat similar to a UTI but is unsure if she was exposed to any STDs.  No abdominal pain, back pain, fevers, abnormal vaginal bleeding, bowel changes noted.  Physical exam findings show copious amount of thick white vaginal discharge.  No cervical motion tenderness or adnexal tenderness noted.  Urinalysis with evidence of leukocytes, rare bacteria.  Wet prep shows WBCs with no trichomonas, yeast or  clue cells.  Patient treated prophylactically for gonorrhea and chlamydia.  Advised to follow-up with remaining lab work when it is available.  Portions of this note were generated with Scientist, clinical (histocompatibility and immunogenetics). Dictation errors may occur despite best attempts at proofreading.   Final Clinical Impressions(s) / ED Diagnoses   Final diagnoses:  Concern about STD in female without diagnosis    ED Discharge Orders    None       Dietrich Pates, PA-C 06/23/17 1656    Raeford Razor, MD 06/24/17 1355

## 2017-06-24 LAB — GC/CHLAMYDIA PROBE AMP (~~LOC~~) NOT AT ARMC
Chlamydia: NEGATIVE
Neisseria Gonorrhea: NEGATIVE

## 2017-06-24 LAB — HIV ANTIBODY (ROUTINE TESTING W REFLEX): HIV Screen 4th Generation wRfx: NONREACTIVE

## 2017-06-24 LAB — RPR: RPR Ser Ql: NONREACTIVE

## 2017-07-12 ENCOUNTER — Ambulatory Visit: Payer: BLUE CROSS/BLUE SHIELD | Admitting: Allergy & Immunology

## 2018-06-07 ENCOUNTER — Emergency Department (HOSPITAL_COMMUNITY)
Admission: EM | Admit: 2018-06-07 | Discharge: 2018-06-07 | Disposition: A | Discharge status: Home / Self Care | Payer: Self-pay | Attending: Emergency Medicine | Admitting: Emergency Medicine

## 2018-06-07 ENCOUNTER — Other Ambulatory Visit: Payer: Self-pay

## 2018-06-07 ENCOUNTER — Encounter (HOSPITAL_COMMUNITY): Payer: Self-pay | Admitting: Emergency Medicine

## 2018-06-07 DIAGNOSIS — J020 Streptococcal pharyngitis: Secondary | ICD-10-CM | POA: Insufficient documentation

## 2018-06-07 DIAGNOSIS — Z79899 Other long term (current) drug therapy: Secondary | ICD-10-CM | POA: Insufficient documentation

## 2018-06-07 DIAGNOSIS — Z91010 Allergy to peanuts: Secondary | ICD-10-CM | POA: Insufficient documentation

## 2018-06-07 DIAGNOSIS — Z87891 Personal history of nicotine dependence: Secondary | ICD-10-CM | POA: Insufficient documentation

## 2018-06-07 LAB — GROUP A STREP BY PCR: Group A Strep by PCR: DETECTED — AB

## 2018-06-07 MED ORDER — PENICILLIN G BENZATHINE 1200000 UNIT/2ML IM SUSP
1.2000 10*6.[IU] | Freq: Once | INTRAMUSCULAR | Status: AC
  Administered 2018-06-07: 1.2 10*6.[IU] via INTRAMUSCULAR
  Filled 2018-06-07: qty 2

## 2018-06-07 NOTE — ED Triage Notes (Signed)
Pt states that he throat is feeling like it is swelling for the past couple of days.

## 2018-06-08 NOTE — ED Provider Notes (Signed)
[facility] EMERGENCY DEPARTMENT Provider Note   CSN: [id number] Arrival date & time: 06/07/18  1329    History   Chief Complaint Chief Complaint  Patient presents with  . Sore Throat    HPI Christina Shah is a 23 y.o. female.     HPI   22yF with sore throat. Onset 2d ago. Persistent since. Constant. Throat feels tight. No cough. No fever. No dyspnea. No sick contacts that she is aware of.   Past Medical History:  Diagnosis Date  . Food allergy   . Strep throat     There are no active problems to display for this patient.   Past Surgical History:  Procedure Laterality Date  . WISDOM TOOTH EXTRACTION       OB History    Gravida  0   Para  0   Term  0   Preterm  0   AB  0   Living  0     SAB  0   TAB  0   Ectopic  0   Multiple  0   Live Births  0            Home Medications    Prior to Admission medications   Medication Sig Start Date End Date Taking? Authorizing Provider  COLLAGEN PO Take by mouth.    [provider]  EPINEPHrine 0.3 mg/0.3 mL IJ SOAJ injection Inject 0.3 mLs (0.3 mg total) into the muscle as needed. 04/13/17   Aliene Beams, MD  Multiple Vitamins-Calcium (ONE-A-DAY WOMENS FORMULA PO) Take by mouth.    [provider]    Family History Family History  Problem Relation Age of Onset  . Hypertension Mother   . Hypertension Father   . Diabetes Maternal Grandmother     Social History Social History   Tobacco Use  . Smoking status: Former Smoker    Years: 0.50    Types: Cigarettes  . Smokeless tobacco: Never Used  Substance Use Topics  . Alcohol use: Yes    Alcohol/week: 2.0 standard drinks    Types: 2 Glasses of wine per week  . Drug use: No     Allergies   Pineapple and Peanuts [peanut oil]   Review of Systems Review of Systems  All systems reviewed and negative, other than as noted in HPI.  Physical Exam Updated Vital Signs BP 113/71   Pulse 78   Temp 98 F (36.7 C) (Oral)    Resp 18   Ht 5\' 5"  (1.651 m)   Wt 72.6 kg   LMP 05/17/2018   SpO2 100%   BMI 26.63 kg/m   Physical Exam Vitals signs and nursing note reviewed.  Constitutional:      General: She is not in acute distress.    Appearance: She is well-developed.  HENT:     Head: Normocephalic and atraumatic.     Comments: exudative pharyngitis/tonsilitis. Uvula midline. Normal sounding voice. Neck supple. shoddy cervical nodes.  Eyes:     General:        Right eye: No discharge.        Left eye: No discharge.     Conjunctiva/sclera: Conjunctivae normal.  Neck:     Musculoskeletal: Neck supple.  Cardiovascular:     Rate and Rhythm: Normal rate and regular rhythm.     Heart sounds: Normal heart sounds. No murmur. No friction rub. No gallop.   Pulmonary:     Effort: Pulmonary effort is normal. No respiratory distress.  Breath sounds: Normal breath sounds.  Abdominal:     General: There is no distension.     Palpations: Abdomen is soft.     Tenderness: There is no abdominal tenderness.  Musculoskeletal:        General: No tenderness.  Skin:    General: Skin is warm and dry.  Neurological:     Mental Status: She is alert.  Psychiatric:        Behavior: Behavior normal.        Thought Content: Thought content normal.      ED Treatments / Results  Labs (all labs ordered are listed, but only abnormal results are displayed) Labs Reviewed  GROUP A STREP BY PCR - Abnormal; Notable for the following components:      Result Value   Group A Strep by PCR DETECTED (*)    All other components within normal limits    EKG None  Radiology No results found.  Procedures Procedures (including critical care time)  Medications Ordered in ED Medications  penicillin g benzathine (BICILLIN LA) 1200000 UNIT/2ML injection 1.2 Million Units (1.2 Million Units Intramuscular Given 06/07/18 1519)     Initial Impression / Assessment and Plan / ED Course  I have reviewed the triage vital signs  and the nursing notes.  Pertinent labs & imaging results that were available during my care of the patient were reviewed by me and considered in my medical decision making (see chart for details).     Sore throat. Strep +. No concerning findings of airway compromise of serious infection. Bicillin. Prn nsaids. Return precautions discussed.   Christina Shah was evaluated in Emergency Department on 06/08/2018 for the symptoms described in the history of present illness. She was evaluated in the context of the global COVID-19 pandemic, which necessitated consideration that the patient might be at risk for infection with the SARS-CoV-2 virus that causes COVID-19. Institutional protocols and algorithms that pertain to the evaluation of patients at risk for COVID-19 are in a state of rapid change based on information released by regulatory bodies including the CDC and federal and state organizations. These policies and algorithms were followed during the patient's care in the ED.   Final Clinical Impressions(s) / ED Diagnoses   Final diagnoses:  Strep pharyngitis    ED Discharge Orders    None       Raeford RazorKohut, Arayla Kruschke, MD 06/08/18 [phone]

## 2018-06-15 ENCOUNTER — Emergency Department (HOSPITAL_COMMUNITY)
Admission: EM | Admit: 2018-06-15 | Discharge: 2018-06-15 | Disposition: A | Discharge status: Home / Self Care | Payer: Self-pay | Attending: Emergency Medicine | Admitting: Emergency Medicine

## 2018-06-15 ENCOUNTER — Other Ambulatory Visit: Payer: Self-pay

## 2018-06-15 ENCOUNTER — Encounter (HOSPITAL_COMMUNITY): Payer: Self-pay | Admitting: Emergency Medicine

## 2018-06-15 DIAGNOSIS — Z87891 Personal history of nicotine dependence: Secondary | ICD-10-CM | POA: Insufficient documentation

## 2018-06-15 DIAGNOSIS — B2790 Infectious mononucleosis, unspecified without complication: Secondary | ICD-10-CM | POA: Insufficient documentation

## 2018-06-15 DIAGNOSIS — Z91010 Allergy to peanuts: Secondary | ICD-10-CM | POA: Insufficient documentation

## 2018-06-15 LAB — MONONUCLEOSIS SCREEN: Mono Screen: POSITIVE — AB

## 2018-06-15 MED ORDER — MAGIC MOUTHWASH W/LIDOCAINE: 10.0000 mL | Freq: Four times a day (QID) | ORAL | 0 refills | Status: DC | PRN

## 2018-06-15 MED ORDER — IBUPROFEN 600 MG PO TABS: 600.0000 mg | ORAL_TABLET | Freq: Four times a day (QID) | ORAL | 0 refills | Status: DC | PRN

## 2018-06-15 MED ORDER — IBUPROFEN 800 MG PO TABS
800.0000 mg | ORAL_TABLET | Freq: Once | ORAL | Status: AC
  Administered 2018-06-15: 800 mg via ORAL
  Filled 2018-06-15: qty 1

## 2018-06-15 MED ORDER — ALUM & MAG HYDROXIDE-SIMETH 200-200-20 MG/5ML PO SUSP
15.0000 mL | Freq: Once | ORAL | Status: AC
  Administered 2018-06-15: 15 mL via ORAL
  Filled 2018-06-15: qty 30

## 2018-06-15 MED ORDER — LIDOCAINE VISCOUS HCL 2 % MT SOLN
15.0000 mL | Freq: Once | OROMUCOSAL | Status: AC
  Administered 2018-06-15: 15 mL via ORAL
  Filled 2018-06-15: qty 15

## 2018-06-15 NOTE — ED Triage Notes (Signed)
Patient states she was treated a week ago for strep throat and given a shot here in the ED. Patient states that 2 days ago she started having a sore throat again.

## 2018-06-15 NOTE — ED Notes (Signed)
PA at bedside.

## 2018-06-15 NOTE — Discharge Instructions (Signed)
Rest and make sure you are drinking plenty of fluids.   °

## 2018-06-15 NOTE — ED Notes (Signed)
ED Provider at bedside. 

## 2018-06-16 NOTE — ED Provider Notes (Signed)
[facility] EMERGENCY DEPARTMENT Provider Note   CSN: [id number] Arrival date & time: 06/15/18  2037    History   Chief Complaint Chief Complaint  Patient presents with  . Sore Throat    HPI Christina Shah is a 22 y.o. female who was seen here 7 days ago for sore throat, tested positive for strep and was treated with bicillin LA, reports felt improved for about 4 days, with returning soreness, redness of her tonsils.  She denies fevers or chills currently, does endorse generalized fatigue. Other pertinent negative include headache, neck pain or stiffness, earache, nasal congestion or drainage and no cough or sob, abdominal pain, n or v.  She has had no known exposures to similar illness. 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 She has tried chloroseptic spray without sx relief.     The history is provided by the patient.    Past Medical History:  Diagnosis Date  . Food allergy   . Strep throat     There are no active problems to display for this patient.   Past Surgical History:  Procedure Laterality Date  . WISDOM TOOTH EXTRACTION       OB History    Gravida  0   Para  0   Term  0   Preterm  0   AB  0   Living  0     SAB  0   TAB  0   Ectopic  0   Multiple  0   Live Births  0            Home Medications    Prior to Admission medications   Medication Sig Start Date End Date Taking? Authorizing Provider  COLLAGEN PO Take 1 capsule by mouth daily.    Yes [provider]  DM-Doxylamine-Acetaminophen (NYQUIL COLD & FLU PO) Take 10-15 mLs by mouth daily as needed (cold and cough).   Yes [provider]  Multiple Vitamins-Calcium (ONE-A-DAY WOMENS FORMULA PO) Take 1 tablet by mouth daily.    Yes [provider]  EPINEPHrine 0.3 mg/0.3 mL IJ SOAJ injection Inject 0.3 mLs (0.3 mg total) into the muscle as needed. Patient not taking: Reported on 06/15/2018 04/13/17   Aliene Beams, MD  ibuprofen  (ADVIL) 600 MG tablet Take 1 tablet (600 mg total) by mouth every 6 (six) hours as needed. 06/15/18   Burgess Amor, PA-C  magic mouthwash w/lidocaine SOLN Take 10 mLs by mouth 4 (four) times daily as needed (throat pain.  Gargle and spit). Note to pharmacy - equal parts diphendydramine, aluminum hydroxide and lidocaine HCL 06/15/18   Burgess Amor, PA-C    Family History Family History  Problem Relation Age of Onset  . Hypertension Mother   . Hypertension Father   . Diabetes Maternal Grandmother     Social History Social History   Tobacco Use  . Smoking status: Former Smoker    Years: 0.50    Types: Cigarettes  . Smokeless tobacco: Never Used  Substance Use Topics  . Alcohol use: Yes    Alcohol/week: 2.0 standard drinks    Types: 2 Glasses of wine per week  . Drug use: No     Allergies   Pineapple and Peanuts [peanut oil]   Review of Systems Review of Systems  Constitutional: Positive for fatigue. Negative for chills and fever.  HENT: Positive for sore throat. Negative for congestion, ear pain, rhinorrhea, sinus pressure, trouble swallowing and voice  change.   Eyes: Negative for discharge.  Respiratory: Negative for cough, shortness of breath, wheezing and stridor.   Cardiovascular: Negative for chest pain.  Gastrointestinal: Negative for abdominal pain, nausea and vomiting.  Genitourinary: Negative.   Musculoskeletal: Negative.  Negative for myalgias.  Skin: Negative.  Negative for rash.     Physical Exam Updated Vital Signs BP 118/73 (BP Location: Right Arm)   Pulse (!) 111   Temp 99.5 F (37.5 C) (Oral)   Resp 18   Ht 5\' 5"  (1.651 m)   Wt 73.5 kg   LMP 05/17/2018   SpO2 100%   BMI 26.96 kg/m   Physical Exam Constitutional:      Appearance: She is well-developed.  HENT:     Head: Normocephalic and atraumatic.     Jaw: There is normal jaw occlusion. No trismus.     Salivary Glands: Right salivary gland is not diffusely enlarged or tender. Left salivary  gland is not diffusely enlarged or tender.     Right Ear: Tympanic membrane and ear canal normal.     Left Ear: Tympanic membrane and ear canal normal.     Nose: No mucosal edema or rhinorrhea.     Mouth/Throat:     Mouth: Mucous membranes are moist.     Pharynx: Uvula midline. No oropharyngeal exudate or posterior oropharyngeal erythema.     Tonsils: Tonsillar exudate present. No tonsillar abscesses. 1+ on the right. 1+ on the left.     Comments: Palatine tonsils not particularly hypertrophied, they are symmetric. Moderate erythema and exudate present.  Eyes:     Conjunctiva/sclera: Conjunctivae normal.  Cardiovascular:     Rate and Rhythm: Normal rate.     Heart sounds: Normal heart sounds.  Pulmonary:     Effort: Pulmonary effort is normal. No respiratory distress.     Breath sounds: No wheezing or rales.  Abdominal:     Palpations: Abdomen is soft.     Tenderness: There is no abdominal tenderness.  Musculoskeletal: Normal range of motion.  Lymphadenopathy:     Head:     Right side of head: Tonsillar adenopathy present. No posterior auricular or occipital adenopathy.     Left side of head: Tonsillar adenopathy present. No posterior auricular or occipital adenopathy.  Skin:    General: Skin is warm and dry.     Findings: No rash.  Neurological:     Mental Status: She is alert and oriented to person, place, and time.      ED Treatments / Results  Labs (all labs ordered are listed, but only abnormal results are displayed) Labs Reviewed  MONONUCLEOSIS SCREEN - Abnormal; Notable for the following components:      Result Value   Mono Screen POSITIVE (*)    All other components within normal limits    EKG None  Radiology No results found.  Procedures Procedures (including critical care time)  Medications Ordered in ED Medications  alum & mag hydroxide-simeth (MAALOX/MYLANTA) 200-200-20 MG/5ML suspension 15 mL (15 mLs Oral Given 06/15/18 2342)    And  lidocaine  (XYLOCAINE) 2 % viscous mouth solution 15 mL (15 mLs Oral Given 06/15/18 2342)  ibuprofen (ADVIL) tablet 800 mg (800 mg Oral Given 06/15/18 2342)     Initial Impression / Assessment and Plan / ED Course  I have reviewed the triage vital signs and the nursing notes.  Pertinent labs & imaging results that were available during my care of the patient were reviewed by me and considered  in my medical decision making (see chart for details).        Pt positive for mono, no concerning oropharyngeal edema. Discussed supportive care while illness runs course. Discussed infectivity, avoidance of close contact with others.  Rest, supportive care.  Pt does not participate in high impact sports or activities. Return precautions outlined, referrals for pcp given.  Final Clinical Impressions(s) / ED Diagnoses   Final diagnoses:  Infectious mononucleosis without complication, infectious mononucleosis due to unspecified organism    ED Discharge Orders         Ordered    ibuprofen (ADVIL) 600 MG tablet  Every 6 hours PRN     06/15/18 2340    magic mouthwash w/lidocaine SOLN  4 times daily PRN     06/15/18 2340           Burgess Amor, PA-C 06/16/18 1134    Long, Arlyss Repress, MD 06/16/18 1346

## 2019-04-24 ENCOUNTER — Other Ambulatory Visit (HOSPITAL_COMMUNITY)
Admission: RE | Admit: 2019-04-24 | Discharge: 2019-04-24 | Disposition: A | Discharge status: Home / Self Care | Payer: Self-pay | Source: Ambulatory Visit | Attending: Nurse Practitioner | Admitting: Nurse Practitioner

## 2019-04-24 DIAGNOSIS — R87610 Atypical squamous cells of undetermined significance on cytologic smear of cervix (ASC-US): Secondary | ICD-10-CM | POA: Insufficient documentation

## 2019-04-30 LAB — SURGICAL PATHOLOGY

## 2019-11-14 ENCOUNTER — Other Ambulatory Visit: Payer: Self-pay

## 2019-11-14 ENCOUNTER — Ambulatory Visit
Admission: RE | Admit: 2019-11-14 | Discharge: 2019-11-14 | Disposition: A | Discharge status: Home / Self Care | Payer: PRIVATE HEALTH INSURANCE | Source: Ambulatory Visit | Attending: Emergency Medicine | Admitting: Emergency Medicine

## 2019-11-14 VITALS — BP 107/76 | HR 76 | Temp 98.7°F | Resp 17 | Ht 65.0 in | Wt 165.0 lb

## 2019-11-14 DIAGNOSIS — M549 Dorsalgia, unspecified: Secondary | ICD-10-CM | POA: Insufficient documentation

## 2019-11-14 DIAGNOSIS — N898 Other specified noninflammatory disorders of vagina: Secondary | ICD-10-CM | POA: Insufficient documentation

## 2019-11-14 DIAGNOSIS — R3989 Other symptoms and signs involving the genitourinary system: Secondary | ICD-10-CM | POA: Insufficient documentation

## 2019-11-14 DIAGNOSIS — R350 Frequency of micturition: Secondary | ICD-10-CM

## 2019-11-14 LAB — POCT URINALYSIS DIP (MANUAL ENTRY)
Bilirubin, UA: NEGATIVE
Glucose, UA: NEGATIVE mg/dL
Ketones, POC UA: NEGATIVE mg/dL
Nitrite, UA: NEGATIVE
Protein Ur, POC: NEGATIVE mg/dL
Spec Grav, UA: 1.025 (ref 1.010–1.025)
Urobilinogen, UA: 0.2 E.U./dL
pH, UA: 5.5 (ref 5.0–8.0)

## 2019-11-14 LAB — POCT URINE PREGNANCY: Preg Test, Ur: NEGATIVE

## 2019-11-14 NOTE — ED Triage Notes (Signed)
Vaginal irritation and itching x 2days. Also report urinary frequency and low back pain.

## 2019-11-14 NOTE — ED Provider Notes (Signed)
[facility] CARE CENTER   [id number] 11/14/19 Arrival Time: 1011   [id number] DISCHARGE  SUBJECTIVE:  Christina Shah is a 24 y.o. female who presents with complaints of vaginal irritation and itching x 2 days as well as urinary frequency, bladder pressure, and low back discomfort.  Admits to using summer eve vaginal wash prior to symptoms.  She has tried OTC AZO.  Denies aggravating factors.  She reports hx of yeast infection, but reports having more itching at that time.  She denies fever, chills, nausea, vomiting, abdominal or pelvic pain, vaginal odor, vaginal bleeding, dyspareunia, vaginal rashes or lesions.   Patient's last menstrual period was 10/22/2019.  ROS: As per HPI.  All other pertinent ROS negative.     Past Medical History:  Diagnosis Date  . Food allergy   . Strep throat    Past Surgical History:  Procedure Laterality Date  . WISDOM TOOTH EXTRACTION     Allergies  Allergen Reactions  . Pineapple Anaphylaxis    Fresh pineapple  . Peanuts [Peanut Oil]    No current facility-administered medications on file prior to encounter.   Current Outpatient Medications on File Prior to Encounter  Medication Sig Dispense Refill  . COLLAGEN PO Take 1 capsule by mouth daily.     Marland Kitchen DM-Doxylamine-Acetaminophen (NYQUIL COLD & FLU PO) Take 10-15 mLs by mouth daily as needed (cold and cough).    . EPINEPHrine 0.3 mg/0.3 mL IJ SOAJ injection Inject 0.3 mLs (0.3 mg total) into the muscle as needed. (Patient not taking: Reported on 06/15/2018) 1 Device 1  . ibuprofen (ADVIL) 600 MG tablet Take 1 tablet (600 mg total) by mouth every 6 (six) hours as needed. 30 tablet 0  . magic mouthwash w/lidocaine SOLN Take 10 mLs by mouth 4 (four) times daily as needed (throat pain.  Gargle and spit). Note to pharmacy - equal parts diphendydramine, aluminum hydroxide and lidocaine HCL 240 mL 0  . Multiple Vitamins-Calcium (ONE-A-DAY WOMENS FORMULA PO) Take 1 tablet by mouth daily.       Social  History   Socioeconomic History  . Marital status: Single    Spouse name: Not on file  . Number of children: Not on file  . Years of education: Not on file  . Highest education level: Not on file  Occupational History  . Not on file  Tobacco Use  . Smoking status: Former Smoker    Years: 0.50    Types: Cigarettes  . Smokeless tobacco: Never Used  Vaping Use  . Vaping Use: Never used  Substance and Sexual Activity  . Alcohol use: Yes    Alcohol/week: 2.0 standard drinks    Types: 2 Glasses of wine per week  . Drug use: No  . Sexual activity: Yes    Birth control/protection: Condom  Other Topics Concern  . Not on file  Social History Narrative   Eats all food groups. Eats apples.    Wear seatbelt.   Exercise.   Works at [employer]. Does not like job. Has been there for a long tie.    Would like to be a dental hygenist.   Live with parents.    Social Determinants of Health   Financial Resource Strain:   . Difficulty of Paying Living Expenses: Not on file  Food Insecurity:   . Worried About Programme researcher, broadcasting/film/video in the Last Year: Not on file  . Ran Out of Food in the Last Year: Not on file  Transportation Needs:   .  Lack of Transportation (Medical): Not on file  . Lack of Transportation (Non-Medical): Not on file  Physical Activity:   . Days of Exercise per Week: Not on file  . Minutes of Exercise per Session: Not on file  Stress:   . Feeling of Stress : Not on file  Social Connections:   . Frequency of Communication with Friends and Family: Not on file  . Frequency of Social Gatherings with Friends and Family: Not on file  . Attends Religious Services: Not on file  . Active Member of Clubs or Organizations: Not on file  . Attends Banker Meetings: Not on file  . Marital Status: Not on file  Intimate Partner Violence:   . Fear of Current or Ex-Partner: Not on file  . Emotionally Abused: Not on file  . Physically Abused: Not on file  . Sexually  Abused: Not on file   Family History  Problem Relation Age of Onset  . Hypertension Mother   . Hypertension Father   . Diabetes Maternal Grandmother     OBJECTIVE:  Vitals:   11/14/19 1041  BP: 107/76  Pulse: 76  Resp: 17  Temp: 98.7 F (37.1 C)  TempSrc: Oral  SpO2: 98%  Weight: 165 lb (74.8 kg)  Height: 5\' 5"  (1.651 m)     General appearance: Alert, NAD, appears stated age Head: NCAT Throat: lips, mucosa, and tongue normal; teeth and gums normal Lungs: CTA bilaterally without adventitious breath sounds Heart: regular rate and rhythm.   Back: no CVA tenderness Abdomen: soft, non-tender; bowel sounds normal; no guarding GU: deferred Skin: warm and dry Psychological:  Alert and cooperative. Normal mood and affect.  LABS:  Results for orders placed or performed during the hospital encounter of 11/14/19  POCT urinalysis dipstick  Result Value Ref Range   Color, UA yellow yellow   Clarity, UA clear clear   Glucose, UA negative negative mg/dL   Bilirubin, UA negative negative   Ketones, POC UA negative negative mg/dL   Spec Grav, UA 13/03/21 2.725 - 1.025   Blood, UA small (A) negative   pH, UA 5.5 5.0 - 8.0   Protein Ur, POC negative negative mg/dL   Urobilinogen, UA 0.2 0.2 or 1.0 E.U./dL   Nitrite, UA Negative Negative   Leukocytes, UA Moderate (2+) (A) Negative  POCT urine pregnancy  Result Value Ref Range   Preg Test, Ur Negative Negative    Labs Reviewed  POCT URINALYSIS DIP (MANUAL ENTRY) - Abnormal; Notable for the following components:      Result Value   Blood, UA small (*)    Leukocytes, UA Moderate (2+) (*)    All other components within normal limits  URINE CULTURE  POCT URINE PREGNANCY  CERVICOVAGINAL ANCILLARY ONLY    ASSESSMENT & PLAN:  1. Vaginal itching   2. Vaginal irritation   3. Urinary frequency   4. Sensation of pressure in bladder area   5. Discomfort of back    Pending: Labs Reviewed  POCT URINALYSIS DIP (MANUAL ENTRY) -  Abnormal; Notable for the following components:      Result Value   Blood, UA small (*)    Leukocytes, UA Moderate (2+) (*)    All other components within normal limits  URINE CULTURE  POCT URINE PREGNANCY  CERVICOVAGINAL ANCILLARY ONLY   Urine without infection Urine culture sent.  We will call with abnormal results Vaginal self-swab obtained.  We will follow up with you regarding abnormal results If tests  results are positive, please abstain from sexual activity until you and your partner(s) have been treated Follow up with PCP as needed Return here or go to ER if you have any new or worsening symptoms fever, chills, nausea, vomiting, abdominal or pelvic pain, painful intercourse, vaginal discharge, vaginal bleeding, persistent symptoms despite treatment, etc...  Reviewed expectations re: course of current medical issues. Questions answered. Outlined signs and symptoms indicating need for more acute intervention. Patient verbalized understanding. After Visit Summary given.       Rennis Harding, PA-C 11/14/19 1101

## 2019-11-14 NOTE — Discharge Instructions (Signed)
Urine without infection Urine culture sent.  We will call with abnormal results Vaginal self-swab obtained.  We will follow up with you regarding abnormal results If tests results are positive, please abstain from sexual activity until you and your partner(s) have been treated Follow up with PCP as needed Return here or go to ER if you have any new or worsening symptoms fever, chills, nausea, vomiting, abdominal or pelvic pain, painful intercourse, vaginal discharge, vaginal bleeding, persistent symptoms despite treatment, etc..Marland Kitchen

## 2019-11-15 ENCOUNTER — Ambulatory Visit
Admission: EM | Admit: 2019-11-15 | Discharge: 2019-11-15 | Disposition: A | Discharge status: Home / Self Care | Payer: Self-pay | Attending: Emergency Medicine | Admitting: Emergency Medicine

## 2019-11-15 DIAGNOSIS — A549 Gonococcal infection, unspecified: Secondary | ICD-10-CM

## 2019-11-15 LAB — CERVICOVAGINAL ANCILLARY ONLY
Bacterial Vaginitis (gardnerella): NEGATIVE
Candida Glabrata: NEGATIVE
Candida Vaginitis: NEGATIVE
Chlamydia: NEGATIVE
Comment: NEGATIVE
Comment: NEGATIVE
Comment: NEGATIVE
Comment: NEGATIVE
Comment: NEGATIVE
Comment: NORMAL
Neisseria Gonorrhea: POSITIVE — AB
Trichomonas: NEGATIVE

## 2019-11-15 LAB — URINE CULTURE: Culture: NO GROWTH

## 2019-11-15 MED ORDER — CEFTRIAXONE SODIUM 500 MG IJ SOLR
500.0000 mg | Freq: Once | INTRAMUSCULAR | Status: AC
  Administered 2019-11-15: 500 mg via INTRAMUSCULAR

## 2019-11-15 NOTE — ED Triage Notes (Signed)
Pt here for gonorrhea treatment.  

## 2019-12-31 ENCOUNTER — Ambulatory Visit: Payer: Self-pay | Admitting: Internal Medicine

## 2020-01-02 ENCOUNTER — Ambulatory Visit: Payer: Self-pay | Admitting: Internal Medicine

## 2020-01-02 ENCOUNTER — Other Ambulatory Visit: Payer: Self-pay

## 2020-01-28 ENCOUNTER — Encounter (HOSPITAL_COMMUNITY): Payer: Self-pay

## 2020-01-28 ENCOUNTER — Emergency Department (HOSPITAL_COMMUNITY): Payer: Self-pay

## 2020-01-28 ENCOUNTER — Other Ambulatory Visit: Payer: Self-pay

## 2020-01-28 ENCOUNTER — Emergency Department (HOSPITAL_COMMUNITY)
Admission: EM | Admit: 2020-01-28 | Discharge: 2020-01-28 | Disposition: A | Discharge status: Home / Self Care | Payer: Self-pay | Attending: Emergency Medicine | Admitting: Emergency Medicine

## 2020-01-28 DIAGNOSIS — W260XXA Contact with knife, initial encounter: Secondary | ICD-10-CM | POA: Insufficient documentation

## 2020-01-28 DIAGNOSIS — Y92000 Kitchen of unspecified non-institutional (private) residence as  the place of occurrence of the external cause: Secondary | ICD-10-CM | POA: Insufficient documentation

## 2020-01-28 DIAGNOSIS — S61011A Laceration without foreign body of right thumb without damage to nail, initial encounter: Secondary | ICD-10-CM | POA: Insufficient documentation

## 2020-01-28 MED ORDER — LIDOCAINE HCL (PF) 1 % IJ SOLN
10.0000 mL | Freq: Once | INTRAMUSCULAR | Status: AC
  Administered 2020-01-28: 10 mL
  Filled 2020-01-28: qty 30

## 2020-01-28 NOTE — Discharge Instructions (Signed)
Keep warm and dry over the next 24 hours.  After that you may run warm soapy water to this area.  Keep clean.  Follow-up in 1 week to have sutures removed.  This can be done at your primary care office, urgent care, ED

## 2020-01-28 NOTE — ED Provider Notes (Signed)
[facility] EMERGENCY DEPARTMENT Provider Note   CSN: [id number] Arrival date & time: 01/28/20  1121    History Chief Complaint  Patient presents with  . Laceration    Christina Shah is a 25 y.o. female with no significant past medical history who presents for evaluation of laceration to right thumb.  Occurred just PTA.  Happened when she was cleaning knives in her kitchen.  Last tetanus 2 years ago.  No fever, chills, nausea, vomiting, bleeding controlled.  No drainage.  No redness, swelling, warmth.  No paresthesias, weakness.  She is able to range fully without difficulty.  No bony tenderness.  Denies additional aggravating or leaving factors.  History obtained from patient and past medial records. No interpretor was used.   HPI     Past Medical History:  Diagnosis Date  . Food allergy   . Strep throat     There are no problems to display for this patient.   Past Surgical History:  Procedure Laterality Date  . WISDOM TOOTH EXTRACTION       OB History    Gravida  0   Para  0   Term  0   Preterm  0   AB  0   Living  0     SAB  0   IAB  0   Ectopic  0   Multiple  0   Live Births  0           Family History  Problem Relation Age of Onset  . Hypertension Mother   . Hypertension Father   . Diabetes Maternal Grandmother     Social History   Tobacco Use  . Smoking status: Former Smoker    Years: 0.50    Types: Cigarettes  . Smokeless tobacco: Never Used  Vaping Use  . Vaping Use: Never used  Substance Use Topics  . Alcohol use: Yes    Alcohol/week: 2.0 standard drinks    Types: 2 Glasses of wine per week  . Drug use: No    Home Medications Prior to Admission medications   Medication Sig Start Date End Date Taking? Authorizing Provider  COLLAGEN PO Take 1 capsule by mouth daily.     [provider]  DM-Doxylamine-Acetaminophen (NYQUIL COLD & FLU PO) Take 10-15 mLs by mouth daily as needed (cold and cough).    [provider]  EPINEPHrine 0.3 mg/0.3 mL IJ SOAJ injection Inject 0.3 mLs (0.3 mg total) into the muscle as needed. Patient not taking: Reported on 06/15/2018 04/13/17   Aliene Beams, MD  ibuprofen (ADVIL) 600 MG tablet Take 1 tablet (600 mg total) by mouth every 6 (six) hours as needed. 06/15/18   Burgess Amor, PA-C  magic mouthwash w/lidocaine SOLN Take 10 mLs by mouth 4 (four) times daily as needed (throat pain.  Gargle and spit). Note to pharmacy - equal parts diphendydramine, aluminum hydroxide and lidocaine HCL 06/15/18   Idol, Raynelle Fanning, PA-C  Multiple Vitamins-Calcium (ONE-A-DAY WOMENS FORMULA PO) Take 1 tablet by mouth daily.     [provider]    Allergies    Pineapple, Other, Peanuts [peanut oil], and Peanut-containing drug products  Review of Systems   Review of Systems  Constitutional: Negative.   HENT: Negative.   Respiratory: Negative.   Cardiovascular: Negative.   Gastrointestinal: Negative.   Genitourinary: Negative.   Musculoskeletal: Negative.   Skin: Positive for wound.  Neurological: Negative.   All other systems reviewed and are negative.  Physical  Exam Updated Vital Signs BP 102/73 (BP Location: Right Arm)   Pulse 87   Temp 97.9 F (36.6 C) (Oral)   Resp 18   Ht 5\' 5"  (1.651 m)   Wt 75.8 kg   LMP 01/24/2020   SpO2 100%   BMI 27.79 kg/m   Physical Exam Vitals and nursing note reviewed.  Constitutional:      General: She is not in acute distress.    Appearance: She is well-developed and well-nourished. She is not ill-appearing, toxic-appearing or diaphoretic.  HENT:     Head: Normocephalic and atraumatic.     Nose: Nose normal.     Mouth/Throat:     Mouth: Mucous membranes are moist.  Eyes:     Pupils: Pupils are equal, round, and reactive to light.  Cardiovascular:     Rate and Rhythm: Normal rate.     Pulses: Normal pulses and intact distal pulses.     Heart sounds: Normal heart sounds.  Pulmonary:     Effort: Pulmonary effort is normal.  No respiratory distress.     Breath sounds: Normal breath sounds.  Abdominal:     General: Bowel sounds are normal. There is no distension.  Musculoskeletal:        General: No swelling, tenderness, deformity or signs of injury. Normal range of motion.       Hands:     Cervical back: Normal range of motion.     Right lower leg: No edema.     Left lower leg: No edema.     Comments: Moves all 4 extremities at difficulty.  No bony tenderness.  Skin:    General: Skin is warm and dry.     Capillary Refill: Capillary refill takes less than 2 seconds.     Comments: 1.5 centimeters laceration to palmar aspect at junction of DIP. No joint involvement. Superficial laceration.  Neurological:     General: No focal deficit present.     Mental Status: She is alert and oriented to person, place, and time.     Sensory: No sensory deficit.     Motor: No weakness.  Psychiatric:        Mood and Affect: Mood and affect normal.    ED Results / Procedures / Treatments   Labs (all labs ordered are listed, but only abnormal results are displayed) Labs Reviewed - No data to display  EKG None  Radiology DG Hand Complete Right  Result Date: 01/28/2020 CLINICAL DATA:  Right thumb laceration. EXAM: RIGHT HAND - COMPLETE 3+ VIEW COMPARISON:  None. FINDINGS: There is no evidence of fracture or dislocation. There is no evidence of arthropathy or other focal bone abnormality. Soft tissues are unremarkable. IMPRESSION: Negative. Electronically Signed   By: 01/30/2020 M.D.   On: 01/28/2020 12:43    Procedures .01/19/2022Laceration Repair  Date/Time: 01/28/2020 1:46 PM Performed by: 01/30/2020, PA-C Authorized by: Linwood Dibbles, PA-C   Consent:    Consent obtained:  Verbal   Consent given by:  Patient   Risks discussed:  Infection, need for additional repair, pain, poor cosmetic result and poor wound healing   Alternatives discussed:  No treatment and delayed treatment Universal protocol:     Procedure explained and questions answered to patient or proxy's satisfaction: yes     Relevant documents present and verified: yes     Test results available: yes     Imaging studies available: yes     Required blood products, implants, devices,  and special equipment available: yes     Site/side marked: yes     Immediately prior to procedure, a time out was called: yes     Patient identity confirmed:  Verbally with patient Anesthesia:    Anesthesia method:  Local infiltration   Local anesthetic:  Lidocaine 1% w/o epi Laceration details:    Location:  Finger   Finger location:  R thumb   Length (cm):  1.5   Depth (mm):  3 Pre-procedure details:    Preparation:  Patient was prepped and draped in usual sterile fashion and imaging obtained to evaluate for foreign bodies Exploration:    Limited defect created (wound extended): no     Hemostasis achieved with:  Direct pressure   Imaging obtained: x-ray     Imaging outcome: foreign body not noted     Wound extent: no foreign bodies/material noted, no muscle damage noted, no nerve damage noted, no tendon damage noted, no underlying fracture noted and no vascular damage noted     Contaminated: no   Treatment:    Area cleansed with:  Saline and povidone-iodine   Amount of cleaning:  Extensive   Irrigation solution:  Sterile saline   Irrigation volume:  1L   Irrigation method:  Pressure wash   Debridement:  None Skin repair:    Repair method:  Sutures   Suture size:  5-0   Suture material:  Prolene   Suture technique:  Simple interrupted   Number of sutures:  3 Approximation:    Approximation:  Close Repair type:    Repair type:  Intermediate Post-procedure details:    Dressing:  Non-adherent dressing   Procedure completion:  Tolerated well, no immediate complications   (including critical care time)  Medications Ordered in ED Medications  lidocaine (PF) (XYLOCAINE) 1 % injection 10 mL (10 mLs Infiltration Given by Other  01/28/20 1337)    ED Course  I have reviewed the triage vital signs and the nursing notes.  Pertinent labs & imaging results that were available during my care of the patient were reviewed by me and considered in my medical decision making (see chart for details).  1.5 cm laceration to palmar aspect right thumb at DMP junction.  No joint involvement.  Neurovascularly intact.  No bony tenderness.  Tetanus up-to-date.  X-ray hand obtained from triage did not show any evidence of bony abnormality.  We will plan on suturing.  Wound sutured with #3 sutures.  Pressure irrigation performed. Wound explored and base of wound visualized in a bloodless field without evidence of foreign body.  Laceration occurred < 8 hours prior to repair which was well tolerated. Pt has no comorbidities to effect normal wound healing. Pt discharged  without antibiotics.  Discussed suture home care with patient and answered questions. Pt to follow-up for wound check and suture removal in 7 days; they are to return to the ED sooner for signs of infection. Pt is hemodynamically stable with no complaints prior to dc.     MDM Rules/Calculators/A&P                           Final Clinical Impression(s) / ED Diagnoses Final diagnoses:  Laceration of right thumb without foreign body without damage to nail, initial encounter    Rx / DC Orders ED Discharge Orders    None       Gatsby Chismar A, PA-C 01/28/20 1347    Bethann Berkshire, MD 02/02/20  1352  

## 2020-01-28 NOTE — ED Triage Notes (Signed)
Pt presents to ED with laceration on right thumb after cutting her thumb washing dishes this morning.

## 2020-02-05 ENCOUNTER — Ambulatory Visit: Payer: Self-pay

## 2020-02-07 ENCOUNTER — Ambulatory Visit
Admission: RE | Admit: 2020-02-07 | Discharge: 2020-02-07 | Disposition: A | Discharge status: Home / Self Care | Payer: HRSA Program | Source: Ambulatory Visit | Attending: Emergency Medicine | Admitting: Emergency Medicine

## 2020-02-07 ENCOUNTER — Other Ambulatory Visit: Payer: Self-pay

## 2020-02-07 ENCOUNTER — Ambulatory Visit (INDEPENDENT_AMBULATORY_CARE_PROVIDER_SITE_OTHER): Payer: HRSA Program

## 2020-02-07 VITALS — BP 115/78 | HR 87 | Temp 97.6°F | Resp 18 | Ht 65.0 in | Wt 167.0 lb

## 2020-02-07 DIAGNOSIS — U071 COVID-19: Secondary | ICD-10-CM

## 2020-02-07 DIAGNOSIS — J208 Acute bronchitis due to other specified organisms: Secondary | ICD-10-CM

## 2020-02-07 DIAGNOSIS — R059 Cough, unspecified: Secondary | ICD-10-CM

## 2020-02-07 MED ORDER — ALBUTEROL SULFATE HFA 108 (90 BASE) MCG/ACT IN AERS: 1.0000 | INHALATION_SPRAY | RESPIRATORY_TRACT | 0 refills | Status: DC | PRN

## 2020-02-07 MED ORDER — PREDNISONE 20 MG PO TABS: 40.0000 mg | ORAL_TABLET | Freq: Every day | ORAL | 0 refills | Status: AC

## 2020-02-07 MED ORDER — FLUTICASONE PROPIONATE 50 MCG/ACT NA SUSP: 2.0000 | Freq: Every day | NASAL | 0 refills | Status: DC

## 2020-02-07 MED ORDER — AEROCHAMBER PLUS MISC: 2 refills | Status: DC

## 2020-02-07 MED ORDER — HYDROCOD POLST-CPM POLST ER 10-8 MG/5ML PO SUER: 5.0000 mL | Freq: Two times a day (BID) | ORAL | 0 refills | Status: DC | PRN

## 2020-02-07 MED ORDER — BENZONATATE 200 MG PO CAPS: 200.0000 mg | ORAL_CAPSULE | Freq: Three times a day (TID) | ORAL | 0 refills | Status: DC | PRN

## 2020-02-07 NOTE — Discharge Instructions (Signed)
Your chest x-ray showed that you have bronchitis.  2 puffs from your albuterol inhaler every 4 hours for 2 days, then every 6 hours for 2 days, then as needed.  Use your spacer with the albuterol inhaler.  Tessalon for the cough during the day, Tussionex for the cough at night.  Flonase, Mucinex, saline nasal irrigation with a Lloyd Huger Med rinse and distilled water as often as you want.  Finish the prednisone unless a provider tells you to stop.  Follow-up with the post Covid care clinic.

## 2020-02-07 NOTE — ED Triage Notes (Signed)
1/8 covid positive.  Has been coughing ever since and cannot sleep at night.

## 2020-02-07 NOTE — ED Provider Notes (Signed)
HPI  SUBJECTIVE:  Christina Shah is a 25 y.o. female who presents with 1 month of a cough worse at night.  She states the cough got worse after being diagnosed with Covid on 1/8.  She reports nasal congestion with postnasal drip, belching, wheezing.  She reports shortness of breath after her diagnosis of COVID.  No dyspnea on exertion.  States that she is unable to sleep at night secondary to the cough.  No sore throat, pleuritic chest pain,burning chest pain, other chest pain, waterbrash, calf pain, cramping, swelling, hemoptysis.  No surgery in the past 4 weeks, recent immobilization.  No dyspnea on exertion.  She is on OCPs.  She does not smoke.  No history of cancer, PE, DVT, pulmonary disease, diabetes, hypertension.  She has a history of GERD.  LMP: 1/12.  Denies possibility of being pregnant.  PMD: None.   Past Medical History:  Diagnosis Date  . Food allergy   . Strep throat     Past Surgical History:  Procedure Laterality Date  . WISDOM TOOTH EXTRACTION      Family History  Problem Relation Age of Onset  . Hypertension Mother   . Hypertension Father   . Diabetes Maternal Grandmother     Social History   Tobacco Use  . Smoking status: Former Smoker    Years: 0.50    Types: Cigarettes  . Smokeless tobacco: Never Used  Vaping Use  . Vaping Use: Never used  Substance Use Topics  . Alcohol use: Yes    Alcohol/week: 2.0 standard drinks    Types: 2 Glasses of wine per week  . Drug use: No    No current facility-administered medications for this encounter.  Current Outpatient Medications:  .  albuterol (VENTOLIN HFA) 108 (90 Base) MCG/ACT inhaler, Inhale 1-2 puffs into the lungs every 4 (four) hours as needed for wheezing or shortness of breath., Disp: 1 each, Rfl: 0 .  benzonatate (TESSALON) 200 MG capsule, Take 1 capsule (200 mg total) by mouth 3 (three) times daily as needed for cough., Disp: 30 capsule, Rfl: 0 .  chlorpheniramine-HYDROcodone (TUSSIONEX PENNKINETIC  ER) 10-8 MG/5ML SUER, Take 5 mLs by mouth every 12 (twelve) hours as needed for cough., Disp: 60 mL, Rfl: 0 .  fluticasone (FLONASE) 50 MCG/ACT nasal spray, Place 2 sprays into both nostrils daily., Disp: 16 g, Rfl: 0 .  predniSONE (DELTASONE) 20 MG tablet, Take 2 tablets (40 mg total) by mouth daily with breakfast for 5 days., Disp: 10 tablet, Rfl: 0 .  Spacer/Aero-Holding Chambers (AEROCHAMBER PLUS) inhaler, Use with inhaler, Disp: 1 each, Rfl: 2 .  COLLAGEN PO, Take 1 capsule by mouth daily. , Disp: , Rfl:  .  EPINEPHrine 0.3 mg/0.3 mL IJ SOAJ injection, Inject 0.3 mLs (0.3 mg total) into the muscle as needed. (Patient not taking: Reported on 06/15/2018), Disp: 1 Device, Rfl: 1 .  ibuprofen (ADVIL) 600 MG tablet, Take 1 tablet (600 mg total) by mouth every 6 (six) hours as needed., Disp: 30 tablet, Rfl: 0 .  Multiple Vitamins-Calcium (ONE-A-DAY WOMENS FORMULA PO), Take 1 tablet by mouth daily. , Disp: , Rfl:   Allergies  Allergen Reactions  . Pineapple Anaphylaxis    Fresh pineapple  . Other Hives, Itching and Swelling  . Peanuts [Peanut Oil]   . Peanut-Containing Drug Products Hives and Itching     ROS  As noted in HPI.   Physical Exam  BP 115/78 (BP Location: Right Arm)   Pulse 87  Temp 97.6 F (36.4 C) (Oral)   Resp 18   Ht 5\' 5"  (1.651 m)   Wt 75.8 kg   LMP 01/24/2020   SpO2 98%   BMI 27.79 kg/m   Constitutional: Well developed, well nourished, no acute distress Eyes:  EOMI, conjunctiva normal bilaterally HENT: Normocephalic, atraumatic,mucus membranes moist.  Positive nasal congestion.  No maxillary, frontal sinus tenderness.  No postnasal drip.  Positive cobblestoning. Respiratory: Normal inspiratory effort, diffuse rhonchi and expiratory wheezing throughout all lung fields on the right more than left Cardiovascular: Normal rate no murmurs rubs or gallop GI: nondistended skin: No rash, skin intact Musculoskeletal calves symmetric, nontender, no edema Neurologic:  Alert & oriented x 3, no focal neuro deficits Psychiatric: Speech and behavior appropriate   ED Course   Medications - No data to display  Orders Placed This Encounter  Procedures  . DG Chest 2 View    Standing Status:   Standing    Number of Occurrences:   1    Order Specific Question:   Reason for Exam (SYMPTOM  OR DIAGNOSIS REQUIRED)    Answer:   Recent COVID rhonchi expiratory wheezing rule out pneumonia, pulmonary infarct    No results found for this or any previous visit (from the past 24 hour(s)). DG Chest 2 View  Result Date: 02/07/2020 CLINICAL DATA:  Cough covid + EXAM: CHEST - 2 VIEW COMPARISON:  Chest radiograph 04/13/2003 FINDINGS: The heart size and mediastinal contours are within normal limits. There is bilateral interstitial prominence. No focal consolidation. No pneumothorax or pleural effusion. No acute finding in the visualized skeleton. IMPRESSION: Bilateral interstitial prominence which is nonspecific but could represent small airways disease (asthma, bronchitis) in the acute setting. Electronically Signed   By: 06/13/2003 M.D.   On: 02/07/2020 16:58    ED Clinical Impression  1. Acute bronchitis due to COVID-19 virus      ED Assessment/Plan  Checking Chest x-ray due to the focal lung findings.  Patient does not meet PERC criteria because of OCP use, however I think that PE is less likely in the absence of tachycardia, calf pain or tenderness, hypoxia.  [place] Narcotic database reviewed for this patient, and feel that the risk/benefit ratio today is favorable for proceeding with a prescription for controlled substance.  Reviewed imaging independently.  Bilateral interstitial prominence suggestive of small airways disease such as asthma or bronchitis.  See radiology report for full details.  Could have some reflux or postnasal drip causing cough prior to Covid infection, but currently will treat as a post COVID bronchitis with regularly scheduled albuterol  for the next 4 days, Tessalon, Tussionex, Flonase, Mucinex, 40 mg of prednisone for 5 days, saline nasal irrigation, follow-up with post Covid care clinic in [place]. To the ER if she gets worse to rule out PE.  Discussed  imaging, MDM, treatment plan, and plan for follow-up with patient. Discussed sn/sx that should prompt return to the ED. patient agrees with plan.   Meds ordered this encounter  Medications  . benzonatate (TESSALON) 200 MG capsule    Sig: Take 1 capsule (200 mg total) by mouth 3 (three) times daily as needed for cough.    Dispense:  30 capsule    Refill:  0  . predniSONE (DELTASONE) 20 MG tablet    Sig: Take 2 tablets (40 mg total) by mouth daily with breakfast for 5 days.    Dispense:  10 tablet    Refill:  0  . Spacer/Aero-Holding Chambers (  AEROCHAMBER PLUS) inhaler    Sig: Use with inhaler    Dispense:  1 each    Refill:  2    Please educate patient on use  . albuterol (VENTOLIN HFA) 108 (90 Base) MCG/ACT inhaler    Sig: Inhale 1-2 puffs into the lungs every 4 (four) hours as needed for wheezing or shortness of breath.    Dispense:  1 each    Refill:  0  . chlorpheniramine-HYDROcodone (TUSSIONEX PENNKINETIC ER) 10-8 MG/5ML SUER    Sig: Take 5 mLs by mouth every 12 (twelve) hours as needed for cough.    Dispense:  60 mL    Refill:  0  . fluticasone (FLONASE) 50 MCG/ACT nasal spray    Sig: Place 2 sprays into both nostrils daily.    Dispense:  16 g    Refill:  0    *This clinic note was created using Scientist, clinical (histocompatibility and immunogenetics). Therefore, there may be occasional mistakes despite careful proofreading.   ?    Domenick Gong, MD 02/08/20 [phone]

## 2020-02-25 ENCOUNTER — Encounter: Payer: Self-pay | Admitting: Emergency Medicine

## 2020-02-25 ENCOUNTER — Ambulatory Visit
Admission: EM | Admit: 2020-02-25 | Discharge: 2020-02-25 | Disposition: A | Discharge status: Home / Self Care | Payer: Self-pay | Attending: Family Medicine | Admitting: Family Medicine

## 2020-02-25 DIAGNOSIS — R058 Other specified cough: Secondary | ICD-10-CM

## 2020-02-25 DIAGNOSIS — J22 Unspecified acute lower respiratory infection: Secondary | ICD-10-CM

## 2020-02-25 MED ORDER — PREDNISONE 20 MG PO TABS: 40.0000 mg | ORAL_TABLET | Freq: Every day | ORAL | 0 refills | Status: DC

## 2020-02-25 MED ORDER — BENZONATATE 200 MG PO CAPS: 200.0000 mg | ORAL_CAPSULE | Freq: Three times a day (TID) | ORAL | 0 refills | Status: DC | PRN

## 2020-02-25 MED ORDER — DOXYCYCLINE HYCLATE 100 MG PO CAPS
100.0000 mg | ORAL_CAPSULE | Freq: Two times a day (BID) | ORAL | 0 refills | Status: DC
Start: 2020-02-25 — End: 2020-08-21

## 2020-02-25 NOTE — ED Triage Notes (Signed)
Pain to LT lower rib x 2 days ago.  Seen here a few weeks ago and dx with bronchitis

## 2020-02-25 NOTE — Discharge Instructions (Signed)
If symptoms have a cough and shortness of breath have not completely improved once completion of his current therapy I would like for you to be seen at the post Covid clinic and she may need further work-up by asthma and allergy specialist vs. pulmonologist.

## 2020-02-25 NOTE — ED Provider Notes (Signed)
[facility]    CSN: [id number] Arrival date & time: 02/25/20  1926      History   Chief Complaint Chief Complaint  Patient presents with  . Chest Pain    HPI Christina Shah is a 25 y.o. female.   HPI  Patient presents today for evaluation of ongoing symptoms of cough, shortness of breath and now lower left rib pain x2 days.  Patient was seen here in clinic on 02/07/2020 following COVID-19 viral infection.  Patient had continued to have worsening respiratory symptoms of shortness of breath and cough.  Patient was treated with prednisone, albuterol inhaler, and prescribed medication for management of cough.  She reports that the albuterol inhaler has not helped her symptoms of shortness of breath.  She has been taking a cough medicine without significant improvement.  She endorses mild improvement while taking the prednisone however all symptoms seem to recur with increased severity after completion of prednisone.  She is afebrile.   Past Medical History:  Diagnosis Date  . Food allergy   . Strep throat     There are no problems to display for this patient.   Past Surgical History:  Procedure Laterality Date  . WISDOM TOOTH EXTRACTION      OB History    Gravida  0   Para  0   Term  0   Preterm  0   AB  0   Living  0     SAB  0   IAB  0   Ectopic  0   Multiple  0   Live Births  0            Home Medications    Prior to Admission medications   Medication Sig Start Date End Date Taking? Authorizing Provider  doxycycline (VIBRAMYCIN) 100 MG capsule Take 1 capsule (100 mg total) by mouth 2 (two) times daily. 02/25/20  Yes Bing Neighbors, FNP  predniSONE (DELTASONE) 20 MG tablet Take 2 tablets (40 mg total) by mouth daily with breakfast. 02/25/20  Yes Bing Neighbors, FNP  albuterol (VENTOLIN HFA) 108 (90 Base) MCG/ACT inhaler Inhale 1-2 puffs into the lungs every 4 (four) hours as needed for wheezing or shortness of breath. 02/07/20    Domenick Gong, MD  benzonatate (TESSALON) 200 MG capsule Take 1 capsule (200 mg total) by mouth 3 (three) times daily as needed for cough. 02/25/20   Bing Neighbors, FNP  chlorpheniramine-HYDROcodone (TUSSIONEX PENNKINETIC ER) 10-8 MG/5ML SUER Take 5 mLs by mouth every 12 (twelve) hours as needed for cough. 02/07/20   Domenick Gong, MD  COLLAGEN PO Take 1 capsule by mouth daily.     [provider]  EPINEPHrine 0.3 mg/0.3 mL IJ SOAJ injection Inject 0.3 mLs (0.3 mg total) into the muscle as needed. Patient not taking: Reported on 06/15/2018 04/13/17   Aliene Beams, MD  fluticasone [facility]) 50 MCG/ACT nasal spray Place 2 sprays into both nostrils daily. 02/07/20   Domenick Gong, MD  ibuprofen (ADVIL) 600 MG tablet Take 1 tablet (600 mg total) by mouth every 6 (six) hours as needed. 06/15/18   Burgess Amor, PA-C  Multiple Vitamins-Calcium (ONE-A-DAY WOMENS FORMULA PO) Take 1 tablet by mouth daily.     [provider]  Spacer/Aero-Holding Chambers (AEROCHAMBER PLUS) inhaler Use with inhaler 02/07/20   Domenick Gong, MD    Family History Family History  Problem Relation Age of Onset  . Hypertension Mother   . Hypertension Father   .  Diabetes Maternal Grandmother     Social History Social History   Tobacco Use  . Smoking status: Former Smoker    Years: 0.50    Types: Cigarettes  . Smokeless tobacco: Never Used  Vaping Use  . Vaping Use: Never used  Substance Use Topics  . Alcohol use: Yes    Alcohol/week: 2.0 standard drinks    Types: 2 Glasses of wine per week  . Drug use: No     Allergies   Pineapple, Other, Peanuts [peanut oil], and Peanut-containing drug products   Review of Systems Review of Systems Pertinent negatives listed in HPI Physical Exam Triage Vital Signs ED Triage Vitals  Enc Vitals Group     BP 02/25/20 1935 116/73     Pulse Rate 02/25/20 1935 100     Resp 02/25/20 1935 18     Temp 02/25/20 1935 98.3 F (36.8 C)     Temp  Source 02/25/20 1935 Oral     SpO2 02/25/20 1935 96 %     Weight --      Height --      Head Circumference --      Peak Flow --      Pain Score 02/25/20 1934 8     Pain Loc --      Pain Edu? --      Excl. in GC? --    No data found.  Updated Vital Signs BP 116/73 (BP Location: Right Arm)   Pulse 100   Temp 98.3 F (36.8 C) (Oral)   Resp 18   SpO2 96%   Visual Acuity Right Eye Distance:   Left Eye Distance:   Bilateral Distance:    Right Eye Near:   Left Eye Near:    Bilateral Near:     Physical Exam General appearance: alert, Ill-appearing, no distress Head: Normocephalic, without obvious abnormality, atraumatic ENT: External ears normal, congestion, oropharynx  Respiratory: Respirations even , unlabored, coarse lung sound, wheeze present Heart: Rate and rhythm normal. No gallop or murmurs noted on exam  Extremities: No gross deformities Skin: Skin color, texture, turgor normal. No rashes seen  Psych: Appropriate mood and affect. Neurologic: Alert, oriented to person, place, and time, thought content appropriate.   UC Treatments / Results  Labs (all labs ordered are listed, but only abnormal results are displayed) Labs Reviewed - No data to display  EKG   Radiology No results found.  Procedures Procedures (including critical care time)  Medications Ordered in UC Medications - No data to display  Initial Impression / Assessment and Plan / UC Course  I have reviewed the triage vital signs and the plan.  Notes.  Pertinent labs & imaging results that were available during my care of the patient were reviewed by me and considered in my medical decision making (see chart for details).     Patient with ongoing symptoms of bronchial inflammation and sinus and nasal symptoms.  Patient has not been treated with a antibiotic we will start doxycycline as well as restart a short course of prednisone and benzonatate for management of cough.  Patient advised to  schedule an appointment with the post Covid clinic for further work-up and evaluation if the symptoms have not completely resolved by completion of this therapy.  Patient is without a primary care provider I did advise her that this clinic also excepting patients for primary care. Final Clinical Impressions(s) / UC Diagnoses   Final diagnoses:  Lower respiratory infection (e.g., bronchitis, pneumonia, pneumonitis,  pulmonitis)  Post-viral cough syndrome     Discharge Instructions     If symptoms have a cough and shortness of breath have not completely improved once completion of his current therapy I would like for you to be seen at the post Covid clinic and she may need further work-up by asthma and allergy specialist vs. pulmonologist.   ED Prescriptions    Medication Sig Dispense Auth. Provider   benzonatate (TESSALON) 200 MG capsule Take 1 capsule (200 mg total) by mouth 3 (three) times daily as needed for cough. 30 capsule Bing Neighbors, FNP   doxycycline (VIBRAMYCIN) 100 MG capsule Take 1 capsule (100 mg total) by mouth 2 (two) times daily. 20 capsule Bing Neighbors, FNP   predniSONE (DELTASONE) 20 MG tablet Take 2 tablets (40 mg total) by mouth daily with breakfast. 10 tablet Bing Neighbors, FNP     PDMP not reviewed this encounter.   Bing Neighbors, FNP 02/25/20 1952

## 2020-02-26 ENCOUNTER — Telehealth: Payer: Self-pay | Admitting: Nurse Practitioner

## 2020-02-26 NOTE — Telephone Encounter (Signed)
Call Pt to schedule  A follow up appointment from her last ED visit Pt stated she was going to get her meds filled after she got off work today and she would call us if she needed [place] after she takes the meds and see how she feel Pt Tested Neg for Covid

## 2020-03-06 ENCOUNTER — Other Ambulatory Visit: Payer: Self-pay

## 2020-03-06 ENCOUNTER — Emergency Department (HOSPITAL_COMMUNITY): Payer: Self-pay

## 2020-03-06 ENCOUNTER — Encounter (HOSPITAL_COMMUNITY): Payer: Self-pay | Admitting: Emergency Medicine

## 2020-03-06 ENCOUNTER — Emergency Department (HOSPITAL_COMMUNITY)
Admission: EM | Admit: 2020-03-06 | Discharge: 2020-03-06 | Disposition: A | Discharge status: Home / Self Care | Payer: Self-pay | Attending: Emergency Medicine | Admitting: Emergency Medicine

## 2020-03-06 DIAGNOSIS — R0602 Shortness of breath: Secondary | ICD-10-CM | POA: Insufficient documentation

## 2020-03-06 DIAGNOSIS — Z8616 Personal history of COVID-19: Secondary | ICD-10-CM | POA: Insufficient documentation

## 2020-03-06 DIAGNOSIS — R059 Cough, unspecified: Secondary | ICD-10-CM | POA: Insufficient documentation

## 2020-03-06 DIAGNOSIS — R000 Tachycardia, unspecified: Secondary | ICD-10-CM | POA: Insufficient documentation

## 2020-03-06 DIAGNOSIS — Z91010 Allergy to peanuts: Secondary | ICD-10-CM | POA: Insufficient documentation

## 2020-03-06 DIAGNOSIS — Z87891 Personal history of nicotine dependence: Secondary | ICD-10-CM | POA: Insufficient documentation

## 2020-03-06 DIAGNOSIS — M940 Chondrocostal junction syndrome [Tietze]: Secondary | ICD-10-CM | POA: Insufficient documentation

## 2020-03-06 LAB — CBC WITH DIFFERENTIAL/PLATELET
Abs Immature Granulocytes: 0.02 10*3/uL (ref 0.00–0.07)
Basophils Absolute: 0.1 10*3/uL (ref 0.0–0.1)
Basophils Relative: 1 %
Eosinophils Absolute: 0.6 10*3/uL — ABNORMAL HIGH (ref 0.0–0.5)
Eosinophils Relative: 9 %
HCT: 40.5 % (ref 36.0–46.0)
Hemoglobin: 13.4 g/dL (ref 12.0–15.0)
Immature Granulocytes: 0 %
Lymphocytes Relative: 44 %
Lymphs Abs: 2.8 10*3/uL (ref 0.7–4.0)
MCH: 29.5 pg (ref 26.0–34.0)
MCHC: 33.1 g/dL (ref 30.0–36.0)
MCV: 89 fL (ref 80.0–100.0)
Monocytes Absolute: 0.4 10*3/uL (ref 0.1–1.0)
Monocytes Relative: 6 %
Neutro Abs: 2.5 10*3/uL (ref 1.7–7.7)
Neutrophils Relative %: 40 %
Platelets: 411 10*3/uL — ABNORMAL HIGH (ref 150–400)
RBC: 4.55 MIL/uL (ref 3.87–5.11)
RDW: 15.3 % (ref 11.5–15.5)
WBC: 6.4 10*3/uL (ref 4.0–10.5)
nRBC: 0 % (ref 0.0–0.2)

## 2020-03-06 LAB — HCG, SERUM, QUALITATIVE: Preg, Serum: NEGATIVE

## 2020-03-06 LAB — I-STAT CHEM 8, ED
BUN: 9 mg/dL (ref 6–20)
Calcium, Ion: 1.26 mmol/L (ref 1.15–1.40)
Chloride: 106 mmol/L (ref 98–111)
Creatinine, Ser: 0.6 mg/dL (ref 0.44–1.00)
Glucose, Bld: 102 mg/dL — ABNORMAL HIGH (ref 70–99)
HCT: 41 % (ref 36.0–46.0)
Hemoglobin: 13.9 g/dL (ref 12.0–15.0)
Potassium: 4.3 mmol/L (ref 3.5–5.1)
Sodium: 136 mmol/L (ref 135–145)
TCO2: 27 mmol/L (ref 22–32)

## 2020-03-06 LAB — BASIC METABOLIC PANEL
Anion gap: 9 (ref 5–15)
BUN: 10 mg/dL (ref 6–20)
CO2: 23 mmol/L (ref 22–32)
Calcium: 9.3 mg/dL (ref 8.9–10.3)
Chloride: 102 mmol/L (ref 98–111)
Creatinine, Ser: 0.58 mg/dL (ref 0.44–1.00)
GFR, Estimated: >60 mL/min (ref >60)
Glucose, Bld: 103 mg/dL — ABNORMAL HIGH (ref 70–99)
Potassium: 3.7 mmol/L (ref 3.5–5.1)
Sodium: 134 mmol/L — ABNORMAL LOW (ref 135–145)

## 2020-03-06 LAB — TROPONIN I (HIGH SENSITIVITY): Troponin I (High Sensitivity): <2 ng/L (ref <18)

## 2020-03-06 MED ORDER — METHOCARBAMOL 500 MG PO TABS: 500.0000 mg | ORAL_TABLET | Freq: Two times a day (BID) | ORAL | 0 refills | Status: DC

## 2020-03-06 MED ORDER — HYDROCOD POLST-CPM POLST ER 10-8 MG/5ML PO SUER: 5.0000 mL | Freq: Two times a day (BID) | ORAL | 0 refills | Status: DC | PRN

## 2020-03-06 MED ORDER — IOHEXOL 350 MG/ML SOLN
100.0000 mL | Freq: Once | INTRAVENOUS | Status: AC | PRN
  Administered 2020-03-06: 100 mL via INTRAVENOUS

## 2020-03-06 MED ORDER — DICLOFENAC SODIUM 1 % EX GEL: 2.0000 g | Freq: Four times a day (QID) | CUTANEOUS | 0 refills | Status: DC

## 2020-03-06 NOTE — ED Notes (Signed)
Pt returned from xray

## 2020-03-06 NOTE — ED Triage Notes (Signed)
Pt c/o shortness of breath with left lower rib pain for over two weeks. Was seen at [facility] x2 for the same. Pt states it has been going on since she had covid in January.

## 2020-03-06 NOTE — ED Provider Notes (Signed)
[facility] EMERGENCY DEPARTMENT Provider Note   CSN: [id number] Arrival date & time: 03/06/20  2000     History Chief Complaint  Patient presents with  . Shortness of Breath    Christina Shah is a 25 y.o. female who presents to the ED today with complaint of gradually worsening SOB s/p COVID 01/08. Pt reports that she went to UC 2-3 weeks after she was diagnosed with COVID with complaints of continued cough and shortness of breath. Pt had a CXR done without acute findings and was discharged home with: tessalon perles, prednisone, albuterol inhaler, cough medication, and flonase. Pt had no improvement in her symptoms and went back to UC on 02/14 with new complaint of left lower rib pain. Pt was prescribed doxycycline at that time with repeat of prednisone and tessalon perles which she states have no helped her symptoms at all. Pt states at first her pain was mostly to the left lateral ribs (no trauma) however has since moved to underneath her left breast. She reports worsening pain with movement, deep inspiration, and laying on the left side. She denies any history of DVT/PE. She is however on OCPs - cannot remember the name of them. Pt is a former smoker. No other complaints at this time.   The history is provided by the patient and medical records.       Past Medical History:  Diagnosis Date  . Food allergy   . Strep throat     There are no problems to display for this patient.   Past Surgical History:  Procedure Laterality Date  . WISDOM TOOTH EXTRACTION       OB History    Gravida  0   Para  0   Term  0   Preterm  0   AB  0   Living  0     SAB  0   IAB  0   Ectopic  0   Multiple  0   Live Births  0           Family History  Problem Relation Age of Onset  . Hypertension Mother   . Hypertension Father   . Diabetes Maternal Grandmother     Social History   Tobacco Use  . Smoking status: Former Smoker    Years: 0.50    Types: Cigarettes  .  Smokeless tobacco: Never Used  Vaping Use  . Vaping Use: Never used  Substance Use Topics  . Alcohol use: Yes    Alcohol/week: 2.0 standard drinks    Types: 2 Glasses of wine per week  . Drug use: No    Home Medications Prior to Admission medications   Medication Sig Start Date End Date Taking? Authorizing Provider  diclofenac Sodium (VOLTAREN) 1 % GEL Apply 2 g topically 4 (four) times daily. 03/06/20  Yes , , PA-C  methocarbamol (ROBAXIN) 500 MG tablet Take 1 tablet (500 mg total) by mouth 2 (two) times daily. 03/06/20  Yes , , PA-C  albuterol (VENTOLIN HFA) 108 (90 Base) MCG/ACT inhaler Inhale 1-2 puffs into the lungs every 4 (four) hours as needed for wheezing or shortness of breath. 02/07/20   Domenick Gong, MD  benzonatate (TESSALON) 200 MG capsule Take 1 capsule (200 mg total) by mouth 3 (three) times daily as needed for cough. 02/25/20   Bing Neighbors, FNP  chlorpheniramine-HYDROcodone (TUSSIONEX PENNKINETIC ER) 10-8 MG/5ML SUER Take 5 mLs by mouth every 12 (twelve) hours as needed for cough. 03/06/20  Hyman Hopes, , PA-C  COLLAGEN PO Take 1 capsule by mouth daily.     [provider]  doxycycline (VIBRAMYCIN) 100 MG capsule Take 1 capsule (100 mg total) by mouth 2 (two) times daily. 02/25/20   Bing Neighbors, FNP  EPINEPHrine 0.3 mg/0.3 mL IJ SOAJ injection Inject 0.3 mLs (0.3 mg total) into the muscle as needed. Patient not taking: Reported on 06/15/2018 04/13/17   Aliene Beams, MD  fluticasone [facility]) 50 MCG/ACT nasal spray Place 2 sprays into both nostrils daily. 02/07/20   Domenick Gong, MD  ibuprofen (ADVIL) 600 MG tablet Take 1 tablet (600 mg total) by mouth every 6 (six) hours as needed. 06/15/18   Burgess Amor, PA-C  Multiple Vitamins-Calcium (ONE-A-DAY WOMENS FORMULA PO) Take 1 tablet by mouth daily.     [provider]  predniSONE (DELTASONE) 20 MG tablet Take 2 tablets (40 mg total) by mouth daily with breakfast.  02/25/20   Bing Neighbors, FNP  Spacer/Aero-Holding Chambers (AEROCHAMBER PLUS) inhaler Use with inhaler 02/07/20   Domenick Gong, MD    Allergies    Pineapple, Other, Peanuts [peanut oil], and Peanut-containing drug products  Review of Systems   Review of Systems  Constitutional: Negative for chills and fever.  Respiratory: Positive for cough and shortness of breath.   Cardiovascular: Positive for chest pain. Negative for palpitations and leg swelling.  All other systems reviewed and are negative.   Physical Exam Updated Vital Signs BP 112/82 (BP Location: Right Arm)   Pulse (!) 106   Temp 98 F (36.7 C) (Oral)   Resp 19   Ht 5\' 5"  (1.651 m)   Wt 77.1 kg   SpO2 99%   BMI 28.29 kg/m   Physical Exam Vitals and nursing note reviewed.  Constitutional:      Appearance: She is not ill-appearing or diaphoretic.  HENT:     Head: Normocephalic and atraumatic.  Eyes:     Conjunctiva/sclera: Conjunctivae normal.  Cardiovascular:     Rate and Rhythm: Regular rhythm. Tachycardia present.     Pulses: Normal pulses.  Pulmonary:     Effort: Pulmonary effort is normal.     Breath sounds: Normal breath sounds. No decreased breath sounds, wheezing, rhonchi or rales.  Chest:     Chest wall: Tenderness present.    Abdominal:     Palpations: Abdomen is soft.     Tenderness: There is no abdominal tenderness. There is no guarding or rebound.  Musculoskeletal:     Cervical back: Neck supple.     Right lower leg: No edema.     Left lower leg: No edema.  Skin:    General: Skin is warm and dry.  Neurological:     Mental Status: She is alert.     ED Results / Procedures / Treatments   Labs (all labs ordered are listed, but only abnormal results are displayed) Labs Reviewed  BASIC METABOLIC PANEL - Abnormal; Notable for the following components:      Result Value   Sodium 134 (*)    Glucose, Bld 103 (*)    All other components within normal limits  CBC WITH  DIFFERENTIAL/PLATELET - Abnormal; Notable for the following components:   Platelets 411 (*)    Eosinophils Absolute 0.6 (*)    All other components within normal limits  I-STAT CHEM 8, ED - Abnormal; Notable for the following components:   Glucose, Bld 102 (*)    All other components within normal limits  HCG, SERUM,  QUALITATIVE  URINALYSIS, ROUTINE W REFLEX MICROSCOPIC  TROPONIN I (HIGH SENSITIVITY)    EKG EKG Interpretation  Date/Time:  Thursday March 06 2020 20:19:04 EST Ventricular Rate:  90 PR Interval:    QRS Duration: 92 QT Interval:  371 QTC Calculation: 454 R Axis:   56 Text Interpretation: Sinus rhythm RSR' in V1 or V2, right VCD or RVH Baseline wander in lead(s) III No old tracing to compare Confirmed by Eber Hong ([id number]) on 03/06/2020 8:20:28 PM   Radiology DG Chest 2 View  Result Date: 03/06/2020 CLINICAL DATA:  Left lower rib pain and shortness of breath. EXAM: CHEST - 2 VIEW COMPARISON:  February 07, 2020 FINDINGS: The heart size and mediastinal contours are within normal limits. Both lungs are clear. The visualized skeletal structures are unremarkable. IMPRESSION: No active cardiopulmonary disease. Electronically Signed   By: Aram Candela M.D.   On: 03/06/2020 20:43   CT Angio Chest PE W/Cm &/Or Wo Cm  Result Date: 03/06/2020 CLINICAL DATA:  Shortness of breath and lower left rib pain for 2 weeks. EXAM: CT ANGIOGRAPHY CHEST WITH CONTRAST TECHNIQUE: Multidetector CT imaging of the chest was performed using the standard protocol during bolus administration of intravenous contrast. Multiplanar CT image reconstructions and MIPs were obtained to evaluate the vascular anatomy. CONTRAST:  OMNIPAQUE IOHEXOL 350 MG/ML SOLN COMPARISON:  None. FINDINGS: Cardiovascular: The thoracic aorta is normal in appearance. Satisfactory opacification of the pulmonary arteries to the segmental level. No evidence of pulmonary embolism. Normal heart size. No pericardial  effusion. Mediastinum/Nodes: No enlarged mediastinal, hilar, or axillary lymph nodes. Thyroid gland, trachea, and esophagus demonstrate no significant findings. Lungs/Pleura: Lungs are clear. No pleural effusion or pneumothorax. Upper Abdomen: No acute abnormality. Musculoskeletal: No chest wall abnormality. No acute or significant osseous findings. Review of the MIP images confirms the above findings. IMPRESSION: Negative for pulmonary embolism or other acute intrathoracic process. Electronically Signed   By: Aram Candela M.D.   On: 03/06/2020 21:31    Procedures Procedures   Medications Ordered in ED Medications  iohexol (OMNIPAQUE) 350 MG/ML injection 100 mL (100 mLs Intravenous Contrast Given 03/06/20 2116)    ED Course  I have reviewed the triage vital signs and the nursing notes.  Pertinent labs & imaging results that were available during my care of the patient were reviewed by me and considered in my medical decision making (see chart for details).    MDM Rules/Calculators/A&P                          25 year old female presents to the ED today with complaint of continued shortness of breath and now left lateral chest wall pain.  Patient had Covid 1/08, was seen twice at urgent care with persistent symptoms, prescribed multiple medications including antibiotics, prednisone, albuterol inhaler without relief.  She presents to the ED today with worsening symptoms.  On arrival to the ED patient is afebrile.  She is tachycardic at 106, nontachypneic, appears to be no acute distress.  Patient is noted to have left anterior chest wall tenderness palpation on exam.  Is equal pulses bilaterally.  No signs of leg edema.  She denies any trauma to this area.  She is on oral contraception however does not remember the name of it.  We will plan to work-up for PE at this time.  Will obtain lab work as well as a chest x-ray, will await kidney function before ordering CTA at this time.  CXR without  any abnormality at this time CBC without leukocytosis and hgb stable at 13.4 I stat chem 8 was collected by nurses - no acute abnormality. Still awaiting official BMP . Preg test negative  CTA negative for PE or other abnormality at this time Troponin and BMP unremarkable a this time. Troponin < 2.   Workup reassuring at this time. I do not feel pt requires repeat troponin today given her pain has been ongoing for > 2 weeks. Suspect pt is likely having costochondritis from excessive coughing; will treat with voltaren gel and muscle relaxer at this time with PCP follow up. Pt given info for post covid care clinic as well. Pt in agreement with plan and stable for discharge home.    Final Clinical Impression(s) / ED Diagnoses Final diagnoses:  Costochondritis, acute    Rx / DC Orders ED Discharge Orders         Ordered    methocarbamol (ROBAXIN) 500 MG tablet  2 times daily        03/06/20 2144    diclofenac Sodium (VOLTAREN) 1 % GEL  4 times daily        03/06/20 2144    chlorpheniramine-HYDROcodone (TUSSIONEX PENNKINETIC ER) 10-8 MG/5ML SUER  Every 12 hours PRN        03/06/20 2144           Discharge Instructions     Your workup was reassuring at this time without any signs of blood clots or other abnormalities in your lungs causing pain. Your pain may be related to inflammation secondary to coughing.   Please pick up medication and take as prescribed. DO NOT DRIVE WHILE ON THE MUSCLE RELAXER AS IT CAN MAKE YOU DROWSY. I would recommend taking this at nighttime to help you sleep.   Continue taking Ibuprofen during the daytime to help with pain. You can also apply the voltargen gel as prescribed to the area to help with pain.   I have represcribed the cough medication for you at your request  Please follow up with your PCP as scheduled for next week  Return to the ED for any worsening symptoms       Tanda RockersVenter, , Cordelia Poche-C 03/06/20 2146    Eber HongMiller, Brian, MD 03/08/20  [phone]

## 2020-03-06 NOTE — ED Notes (Signed)
No e-signature pad available at discharge.  Pt verbalized understanding of d/c instructions and not to drive or operate machinery after taking muscle relaxer or cough med.

## 2020-03-06 NOTE — Discharge Instructions (Addendum)
Your workup was reassuring at this time without any signs of blood clots or other abnormalities in your lungs causing pain. Your pain may be related to inflammation secondary to coughing.   Please pick up medication and take as prescribed. DO NOT DRIVE WHILE ON THE MUSCLE RELAXER AS IT CAN MAKE YOU DROWSY. I would recommend taking this at nighttime to help you sleep.   Continue taking Ibuprofen during the daytime to help with pain. You can also apply the voltargen gel as prescribed to the area to help with pain.   I have represcribed the cough medication for you at your request  Please follow up with your PCP as scheduled for next week  Return to the ED for any worsening symptoms

## 2020-03-26 ENCOUNTER — Ambulatory Visit: Payer: Self-pay | Admitting: Internal Medicine

## 2020-08-21 ENCOUNTER — Other Ambulatory Visit: Payer: Self-pay

## 2020-08-21 ENCOUNTER — Ambulatory Visit (INDEPENDENT_AMBULATORY_CARE_PROVIDER_SITE_OTHER): Payer: Self-pay | Admitting: Nurse Practitioner

## 2020-08-21 ENCOUNTER — Encounter: Payer: Self-pay | Admitting: Nurse Practitioner

## 2020-08-21 VITALS — BP 120/81 | HR 88 | Temp 97.7°F | Ht 65.0 in | Wt 165.0 lb

## 2020-08-21 DIAGNOSIS — Z7689 Persons encountering health services in other specified circumstances: Secondary | ICD-10-CM | POA: Insufficient documentation

## 2020-08-21 DIAGNOSIS — R059 Cough, unspecified: Secondary | ICD-10-CM | POA: Insufficient documentation

## 2020-08-21 DIAGNOSIS — Z3009 Encounter for other general counseling and advice on contraception: Secondary | ICD-10-CM | POA: Insufficient documentation

## 2020-08-21 DIAGNOSIS — Z91018 Allergy to other foods: Secondary | ICD-10-CM | POA: Insufficient documentation

## 2020-08-21 MED ORDER — EPINEPHRINE 0.3 MG/0.3ML IJ SOAJ: 0.3000 mg | INTRAMUSCULAR | 1 refills | Status: DC | PRN

## 2020-08-21 MED ORDER — ALBUTEROL SULFATE HFA 108 (90 BASE) MCG/ACT IN AERS: 1.0000 | INHALATION_SPRAY | RESPIRATORY_TRACT | 0 refills | Status: DC | PRN

## 2020-08-21 NOTE — Assessment & Plan Note (Signed)
-  Rx. epipen

## 2020-08-21 NOTE — Patient Instructions (Signed)
Please have fasting labs drawn 2-3 days prior to your appointment so we can discuss the results during your office visit.  

## 2020-08-21 NOTE — Assessment & Plan Note (Addendum)
-  has had evening cough since contracting COVID in January -will get CXR today -refilled albuterol -cough is persistent in the evening; will refer to pulmonary for PFTs; r/o asthma -she states she didn't have asthma as a child, but has severe allergies to pineapple and peanuts

## 2020-08-21 NOTE — Progress Notes (Signed)
New Patient Office Visit  Subjective:  Patient ID: Christina Shah, female    DOB: 04-28-1995  Age: 25 y.o. MRN: [id number]  CC:  Chief Complaint  Patient presents with   New Patient (Initial Visit)    Here to establish care. Had Covid in January, still has the cough and is worst at nighttime.    HPI Christina Shah presents for new patient visit.  No recent PCP. No recent physical, and no recent labs.  She last went to the health dept in [place].  She had COVID at beginning of January, but she has had persistent cough since then. She states he cough usually starts around 6-8 pm. She states she ordered some pill called clear lungs, and that helped quite a bit.   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 Eats apples.    Wear seatbelt.   Exercise.   Lives alone.   Social Determinants of Health   Financial Resource Strain: Not on file  Food Insecurity: Not on file  Transportation  Needs: Not on file  Physical Activity: Not on file  Stress: Not on file  Social Connections: Not on file  Intimate Partner Violence: Not on file    ROS Review of Systems  Constitutional: Negative.   Respiratory:  Positive for cough, chest tightness and wheezing.        Happens at 6-8 pm each night  Cardiovascular: Negative.   Genitourinary: Negative.   Psychiatric/Behavioral: Negative.     Objective:   Today's Vitals: BP 120/81 (BP Location: Left Arm, Patient Position: Sitting, Cuff Size: Normal)   Pulse 88   Temp 97.7 F (36.5 C) (Temporal)   Ht _0  (1.651 m)   Wt 165 lb (74.8 kg)   LMP 07/25/2020 (Approximate)   SpO2 97%   BMI 27.46 kg/m   Physical Exam Constitutional:      Appearance: Normal appearance.  Cardiovascular:     Rate and Rhythm: Normal rate and regular rhythm.     Pulses: Normal pulses.     Heart sounds: Normal heart sounds.  Pulmonary:     Effort: Pulmonary effort is normal.     Breath sounds: Normal breath sounds.  Musculoskeletal:  General: Normal range of motion.  Neurological:     Mental Status: She is alert.  Psychiatric:        Mood and Affect: Mood normal.        Behavior: Behavior normal.        Thought Content: Thought content normal.        Judgment: Judgment normal.    Assessment & Plan:   Problem List Items Addressed This Visit       Other   Encounter to establish care - Primary    -obtain records      Relevant Orders   CBC with Differential/Platelet   CMP14+EGFR   Lipid Panel With LDL/HDL Ratio   TSH   Cough    -has had evening cough since contracting COVID in January -will get CXR today -refilled albuterol -cough is persistent in the evening; will refer to pulmonary for PFTs; r/o asthma -she states she didn't have asthma as a child, but has severe allergies to pineapple and peanuts      Relevant Medications   albuterol (VENTOLIN HFA) 108 (90 Base) MCG/ACT inhaler   Other Relevant Orders   DG Chest 2 View    Ambulatory referral to Pulmonology   Food allergy    -Rx. epipen      Relevant Medications   EPINEPHrine 0.3 mg/0.3 mL IJ SOAJ injection   Birth control counseling    -she states she wants to switch birth control, but just picked up a 70-monthsupply of her current medication -she would like GYN referral, will honor that request      Relevant Orders   Ambulatory referral to Gynecology    Outpatient Encounter Medications as of 08/21/2020  Medication Sig   fluticasone (FLONASE) 50 MCG/ACT nasal spray Place 2 sprays into both nostrils daily.   ibuprofen (ADVIL) 600 MG tablet Take 1 tablet (600 mg total) by mouth every 6 (six) hours as needed.   Multiple Vitamins-Calcium (ONE-A-DAY WOMENS FORMULA PO) Take 1 tablet by mouth daily.    albuterol (VENTOLIN HFA) 108 (90 Base) MCG/ACT inhaler Inhale 1-2 puffs into the lungs every 4 (four) hours as needed for wheezing or shortness of breath.   EPINEPHrine 0.3 mg/0.3 mL IJ SOAJ injection Inject 0.3 mg into the muscle as needed.   [DISCONTINUED] albuterol (VENTOLIN HFA) 108 (90 Base) MCG/ACT inhaler Inhale 1-2 puffs into the lungs every 4 (four) hours as needed for wheezing or shortness of breath. (Patient not taking: Reported on 08/21/2020)   [DISCONTINUED] benzonatate (TESSALON) 200 MG capsule Take 1 capsule (200 mg total) by mouth 3 (three) times daily as needed for cough. (Patient not taking: Reported on 08/21/2020)   [DISCONTINUED] chlorpheniramine-HYDROcodone (TUSSIONEX PENNKINETIC ER) 10-8 MG/5ML SUER Take 5 mLs by mouth every 12 (twelve) hours as needed for cough. (Patient not taking: Reported on 08/21/2020)   [DISCONTINUED] COLLAGEN PO Take 1 capsule by mouth daily.  (Patient not taking: Reported on 08/21/2020)   [DISCONTINUED] diclofenac Sodium (VOLTAREN) 1 % GEL Apply 2 g topically 4 (four) times daily. (Patient not taking: Reported on 08/21/2020)   [DISCONTINUED] doxycycline (VIBRAMYCIN) 100 MG capsule Take 1 capsule (100 mg total) by mouth 2  (two) times daily.   [DISCONTINUED] EPINEPHrine 0.3 mg/0.3 mL IJ SOAJ injection Inject 0.3 mLs (0.3 mg total) into the muscle as needed. (Patient not taking: No sig reported)   [DISCONTINUED] methocarbamol (ROBAXIN) 500 MG tablet Take 1 tablet (500 mg total) by mouth 2 (two) times daily. (Patient not taking: Reported on 08/21/2020)   [  DISCONTINUED] predniSONE (DELTASONE) 20 MG tablet Take 2 tablets (40 mg total) by mouth daily with breakfast. (Patient not taking: Reported on 08/21/2020)   [DISCONTINUED] Spacer/Aero-Holding Chambers (AEROCHAMBER PLUS) inhaler Use with inhaler (Patient not taking: Reported on 08/21/2020)   No facility-administered encounter medications on file as of 08/21/2020.    Follow-up: Return in about 2 weeks (around 09/04/2020) for Physical Exam (No PAP).   Noreene Larsson, NP

## 2020-08-21 NOTE — Assessment & Plan Note (Signed)
-  she states she wants to switch birth control, but just picked up a 82-month supply of her current medication -she would like GYN referral, will honor that request

## 2020-08-21 NOTE — Assessment & Plan Note (Signed)
-  obtain records 

## 2020-09-18 ENCOUNTER — Other Ambulatory Visit: Payer: Self-pay

## 2020-09-18 ENCOUNTER — Ambulatory Visit: Payer: Self-pay | Admitting: Advanced Practice Midwife

## 2020-09-18 ENCOUNTER — Encounter: Payer: Self-pay | Admitting: Advanced Practice Midwife

## 2020-09-18 VITALS — BP 109/67 | HR 94 | Ht 65.0 in | Wt 166.0 lb

## 2020-09-18 DIAGNOSIS — Z30011 Encounter for initial prescription of contraceptive pills: Secondary | ICD-10-CM

## 2020-09-18 NOTE — Progress Notes (Signed)
[facility] Visit  Patient name: Christina Shah MRN [id number]  Date of birth: 31-Jul-1995  CC & HPI:  Christina Shah is a 25 y.o. African American female presenting today for birth control options.  Is self pay.  On COCs for about a year, still makes nauseous and wants something that will continue to help w/acne.  Options reviewed.  Pertinent History Reviewed:  Medical & Surgical Hx:   Past Medical History:  Diagnosis Date   Food allergy    Strep throat    Past Surgical History:  Procedure Laterality Date   WISDOM TOOTH EXTRACTION     Family History  Problem Relation Age of Onset   Diabetes Maternal Grandmother    Cancer Father    Hypertension Father    Hypertension Mother     Current Outpatient Medications:    albuterol (VENTOLIN HFA) 108 (90 Base) MCG/ACT inhaler, Inhale 1-2 puffs into the lungs every 4 (four) hours as needed for wheezing or shortness of breath., Disp: 1 each, Rfl: 0   EPINEPHrine 0.3 mg/0.3 mL IJ SOAJ injection, Inject 0.3 mg into the muscle as needed., Disp: 1 each, Rfl: 1   fluticasone (FLONASE) 50 MCG/ACT nasal spray, Place 2 sprays into both nostrils daily., Disp: 16 g, Rfl: 0   ibuprofen (ADVIL) 600 MG tablet, Take 1 tablet (600 mg total) by mouth every 6 (six) hours as needed., Disp: 30 tablet, Rfl: 0   Multiple Vitamins-Calcium (ONE-A-DAY WOMENS FORMULA PO), Take 1 tablet by mouth daily. , Disp: , Rfl:    Norgestim-Eth Estrad Triphasic (TRI-SPRINTEC PO), Take by mouth., Disp: , Rfl:  Social History: Reviewed -  reports that she has never smoked. She has never used smokeless tobacco.  Review of Systems:   Constitutional: Negative for fever and chills Eyes: Negative for visual disturbances Respiratory: Negative for shortness of breath, dyspnea Cardiovascular: Negative for chest pain or palpitations  Gastrointestinal: Negative for vomiting, diarrhea and constipation; no abdominal pain Genitourinary: Negative for dysuria and urgency,  vaginal irritation or itching Musculoskeletal: Negative for back pain, joint pain, myalgias  Neurological: Negative for dizziness and headaches    Objective Findings:    Physical Examination: Vitals:   09/18/20 1458  BP: 109/67  Pulse: 94   General appearance - well appearing, and in no distress Mental status - alert, oriented to person, place, and time Chest:  Normal respiratory effort Heart - normal rate and regular rhythm Pelvic: deferred Musculoskeletal:  Normal range of motion without pain Extremities:  No edema    No results found for this or any previous visit (from the past 24 hour(s)).    Assessment & Plan:  A:   Contraception mgt.  P:  LoLoestrin sample.  Message scheduled to send on 10/5 for f/u (pt is self pay, will sample)   No follow-ups on file.  Jacklyn Shell CNM 09/18/2020 3:31 PM

## 2020-09-26 ENCOUNTER — Encounter: Payer: Self-pay | Admitting: Nurse Practitioner

## 2020-10-16 ENCOUNTER — Institutional Professional Consult (permissible substitution): Payer: Self-pay | Admitting: Pulmonary Disease

## 2020-10-27 ENCOUNTER — Other Ambulatory Visit: Payer: Self-pay | Admitting: Advanced Practice Midwife

## 2020-10-27 MED ORDER — NORETHIN-ETH ESTRAD-FE BIPHAS 1 MG-10 MCG / 10 MCG PO TABS: 1.0000 | ORAL_TABLET | Freq: Every day | ORAL | 11 refills | Status: DC

## 2021-01-19 ENCOUNTER — Telehealth: Payer: Self-pay | Admitting: Obstetrics & Gynecology

## 2021-01-19 NOTE — Telephone Encounter (Signed)
Patient wanted to see if we had refills of her birth control, lo loestrin fe ,available. Please advise.

## 2021-01-28 ENCOUNTER — Ambulatory Visit: Payer: Self-pay

## 2021-01-28 ENCOUNTER — Ambulatory Visit
Admission: RE | Admit: 2021-01-28 | Discharge: 2021-01-28 | Disposition: A | Discharge status: Home / Self Care | Payer: Self-pay | Source: Ambulatory Visit | Attending: Family Medicine | Admitting: Family Medicine

## 2021-01-28 ENCOUNTER — Other Ambulatory Visit: Payer: Self-pay

## 2021-01-28 VITALS — BP 107/76 | HR 98 | Temp 98.1°F | Resp 18

## 2021-01-28 DIAGNOSIS — N898 Other specified noninflammatory disorders of vagina: Secondary | ICD-10-CM | POA: Insufficient documentation

## 2021-01-28 DIAGNOSIS — R350 Frequency of micturition: Secondary | ICD-10-CM | POA: Insufficient documentation

## 2021-01-28 LAB — POCT URINALYSIS DIP (MANUAL ENTRY)
Bilirubin, UA: NEGATIVE
Glucose, UA: NEGATIVE mg/dL
Leukocytes, UA: NEGATIVE
Nitrite, UA: NEGATIVE
Protein Ur, POC: 30 mg/dL — AB
Spec Grav, UA: 1.025 (ref 1.010–1.025)
Urobilinogen, UA: 1 E.U./dL
pH, UA: 7 (ref 5.0–8.0)

## 2021-01-28 NOTE — ED Provider Notes (Signed)
[facility]    CSN: [id number] Arrival date & time: 01/28/21  1447      History   Chief Complaint Chief Complaint  Patient presents with   Urinary Tract Infection   HPI Christina Shah is a 26 y.o. female.   Presenting today with 1 week history of urinary frequency and hesitancy, bilateral low back aching, white vaginal discharge.  Denies abdominal pain, fever, chills, nausea, vomiting, diarrhea, dysuria, new sexual partners or concern for STDs, pregnancy.  So far not tried anything over-the-counter for symptoms.  LMP was 01/12/21.   Past Medical History:  Diagnosis Date   Food allergy    Strep throat    Patient Active Problem List   Diagnosis Date Noted   Encounter to establish care 08/21/2020   Cough 08/21/2020   Food allergy 08/21/2020   Birth control counseling 08/21/2020   Past Surgical History:  Procedure Laterality Date   WISDOM TOOTH EXTRACTION     OB History     Gravida  0   Para  0   Term  0   Preterm  0   AB  0   Living  0      SAB  0   IAB  0   Ectopic  0   Multiple  0   Live Births  0          Home Medications    Prior to Admission medications   Medication Sig Start Date End Date Taking? Authorizing Provider  albuterol (VENTOLIN HFA) 108 (90 Base) MCG/ACT inhaler Inhale 1-2 puffs into the lungs every 4 (four) hours as needed for wheezing or shortness of breath. 08/21/20   Heather RobertsGray, Joseph M, NP  EPINEPHrine 0.3 mg/0.3 mL IJ SOAJ injection Inject 0.3 mg into the muscle as needed. 08/21/20   Heather RobertsGray, Joseph M, NP  fluticasone Aleda Grana(FLONASE) 50 MCG/ACT nasal spray Place 2 sprays into both nostrils daily. 02/07/20   Domenick GongMortenson, Ashley, MD  ibuprofen (ADVIL) 600 MG tablet Take 1 tablet (600 mg total) by mouth every 6 (six) hours as needed. 06/15/18   Burgess AmorIdol, Julie, PA-C  Multiple Vitamins-Calcium (ONE-A-DAY WOMENS FORMULA PO) Take 1 tablet by mouth daily.     [provider]  Norethindrone-Ethinyl Estradiol-Fe Biphas (LO LOESTRIN FE) 1  MG-10 MCG / 10 MCG tablet Take 1 tablet by mouth daily. 10/27/20   Cresenzo-Dishmon, Scarlette CalicoFrances, CNM   Family History Family History  Problem Relation Age of Onset   Diabetes Maternal Grandmother    Cancer Father    Hypertension Father    Hypertension Mother    Social History Social History   Tobacco Use   Smoking status: Never   Smokeless tobacco: Never  Vaping Use   Vaping Use: Never used  Substance Use Topics   Alcohol use: Yes    Alcohol/week: 2.0 standard drinks    Types: 2 Glasses of wine per week    Comment: occ   Drug use: No    Allergies   Pineapple, Other, Peanuts [peanut oil], and Peanut-containing drug products  Review of Systems Review of Systems Per HPI  Physical Exam Triage Vital Signs ED Triage Vitals  Enc Vitals Group     BP 01/28/21 1456 107/76     Pulse Rate 01/28/21 1456 98     Resp 01/28/21 1456 18     Temp 01/28/21 1456 98.1 F (36.7 C)     Temp Source 01/28/21 1456 Oral     SpO2 01/28/21 1456 98 %  Weight --      Height --      Head Circumference --      Peak Flow --      Pain Score 01/28/21 1454 0     Pain Loc --      Pain Edu? --      Excl. in GC? --    No data found.  Updated Vital Signs BP 107/76 (BP Location: Right Arm)    Pulse 98    Temp 98.1 F (36.7 C) (Oral)    Resp 18    LMP 01/12/2021 (Approximate)    SpO2 98%   Visual Acuity Right Eye Distance:   Left Eye Distance:   Bilateral Distance:    Right Eye Near:   Left Eye Near:    Bilateral Near:     Physical Exam Vitals and nursing note reviewed.  Constitutional:      Appearance: Normal appearance. She is not ill-appearing.  HENT:     Head: Atraumatic.  Eyes:     Extraocular Movements: Extraocular movements intact.     Conjunctiva/sclera: Conjunctivae normal.  Cardiovascular:     Rate and Rhythm: Normal rate and regular rhythm.     Heart sounds: Normal heart sounds.  Pulmonary:     Effort: Pulmonary effort is normal.     Breath sounds: Normal breath  sounds.  Abdominal:     General: Bowel sounds are normal. There is no distension.     Palpations: Abdomen is soft.     Tenderness: There is no abdominal tenderness. There is no right CVA tenderness, left CVA tenderness or guarding.  Genitourinary:    Comments: GU exam deferred, self swab performed Musculoskeletal:        General: Normal range of motion.     Cervical back: Normal range of motion and neck supple.  Skin:    General: Skin is warm and dry.  Neurological:     Mental Status: She is alert and oriented to person, place, and time.  Psychiatric:        Mood and Affect: Mood normal.        Thought Content: Thought content normal.        Judgment: Judgment normal.     UC Treatments / Results  Labs (all labs ordered are listed, but only abnormal results are displayed) Labs Reviewed  POCT URINALYSIS DIP (MANUAL ENTRY) - Abnormal; Notable for the following components:      Result Value   Clarity, UA cloudy (*)    Ketones, POC UA small (15) (*)    Blood, UA trace-lysed (*)    Protein Ur, POC =30 (*)    All other components within normal limits  URINE CULTURE  CERVICOVAGINAL ANCILLARY ONLY    EKG   Radiology No results found.  Procedures Procedures (including critical care time)  Medications Ordered in UC Medications - No data to display  Initial Impression / Assessment and Plan / UC Course  I have reviewed the triage vital signs and the nursing notes.  Pertinent labs & imaging results that were available during my care of the patient were reviewed by me and considered in my medical decision making (see chart for details).     Vitals and exam benign and reassuring, urinalysis without evidence of a current urinary tract infection.  We will send for urine culture given her symptoms and also send for vaginal swab.  Discussed supportive over-the-counter medications and home care, return precautions.  We will treat based on results.  Final Clinical Impressions(s) /  UC Diagnoses   Final diagnoses:  Urinary frequency  Vaginal discharge   Discharge Instructions   None    ED Prescriptions   None    PDMP not reviewed this encounter.   Particia Nearing, [place] 01/28/21 1558

## 2021-01-28 NOTE — ED Triage Notes (Signed)
Patient states that last week she started having back pains and frequent urination  Denies Meds  Denies Fever

## 2021-01-29 LAB — CERVICOVAGINAL ANCILLARY ONLY
Bacterial Vaginitis (gardnerella): NEGATIVE
Candida Glabrata: NEGATIVE
Candida Vaginitis: NEGATIVE
Chlamydia: NEGATIVE
Comment: NEGATIVE
Comment: NEGATIVE
Comment: NEGATIVE
Comment: NEGATIVE
Comment: NEGATIVE
Comment: NORMAL
Neisseria Gonorrhea: NEGATIVE
Trichomonas: NEGATIVE

## 2021-01-30 LAB — URINE CULTURE: Culture: 40000 — AB

## 2021-04-13 ENCOUNTER — Telehealth: Payer: Self-pay | Admitting: Women's Health

## 2021-04-13 NOTE — Telephone Encounter (Signed)
Patient is calling to see if we have anymore samples of the birth control of someone could call her please ?

## 2021-04-13 NOTE — Telephone Encounter (Signed)
Returned pt's call, no answer, left vm stating that we have samples of the [facility] that she is currently on. Placed 6 boxes in a bag for pt to pu per Fran's note at last visit d/t being self-pay. Pt to call or send Mychart msg when she is able to pick them up. ?

## 2021-06-05 IMAGING — DX DG HAND COMPLETE 3+V*R*
3 series · 3 of 3 positions shown · non-contrast
Comparison: None.

CLINICAL DATA: Right thumb laceration.

EXAM:
RIGHT HAND - COMPLETE 3+ VIEW

[hand pa]
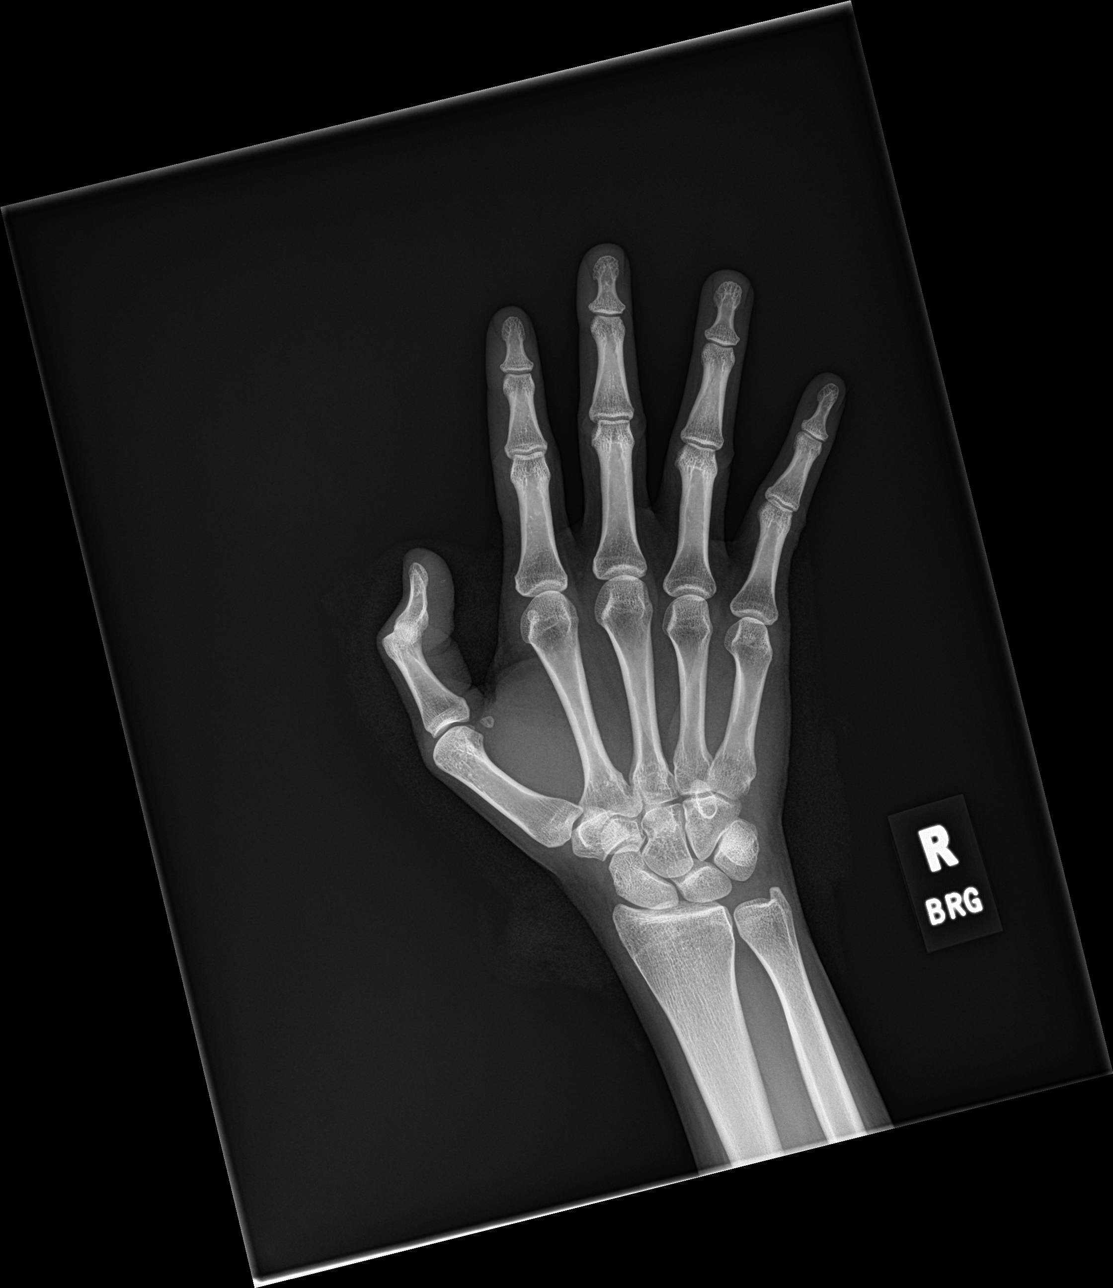

[hand obl]
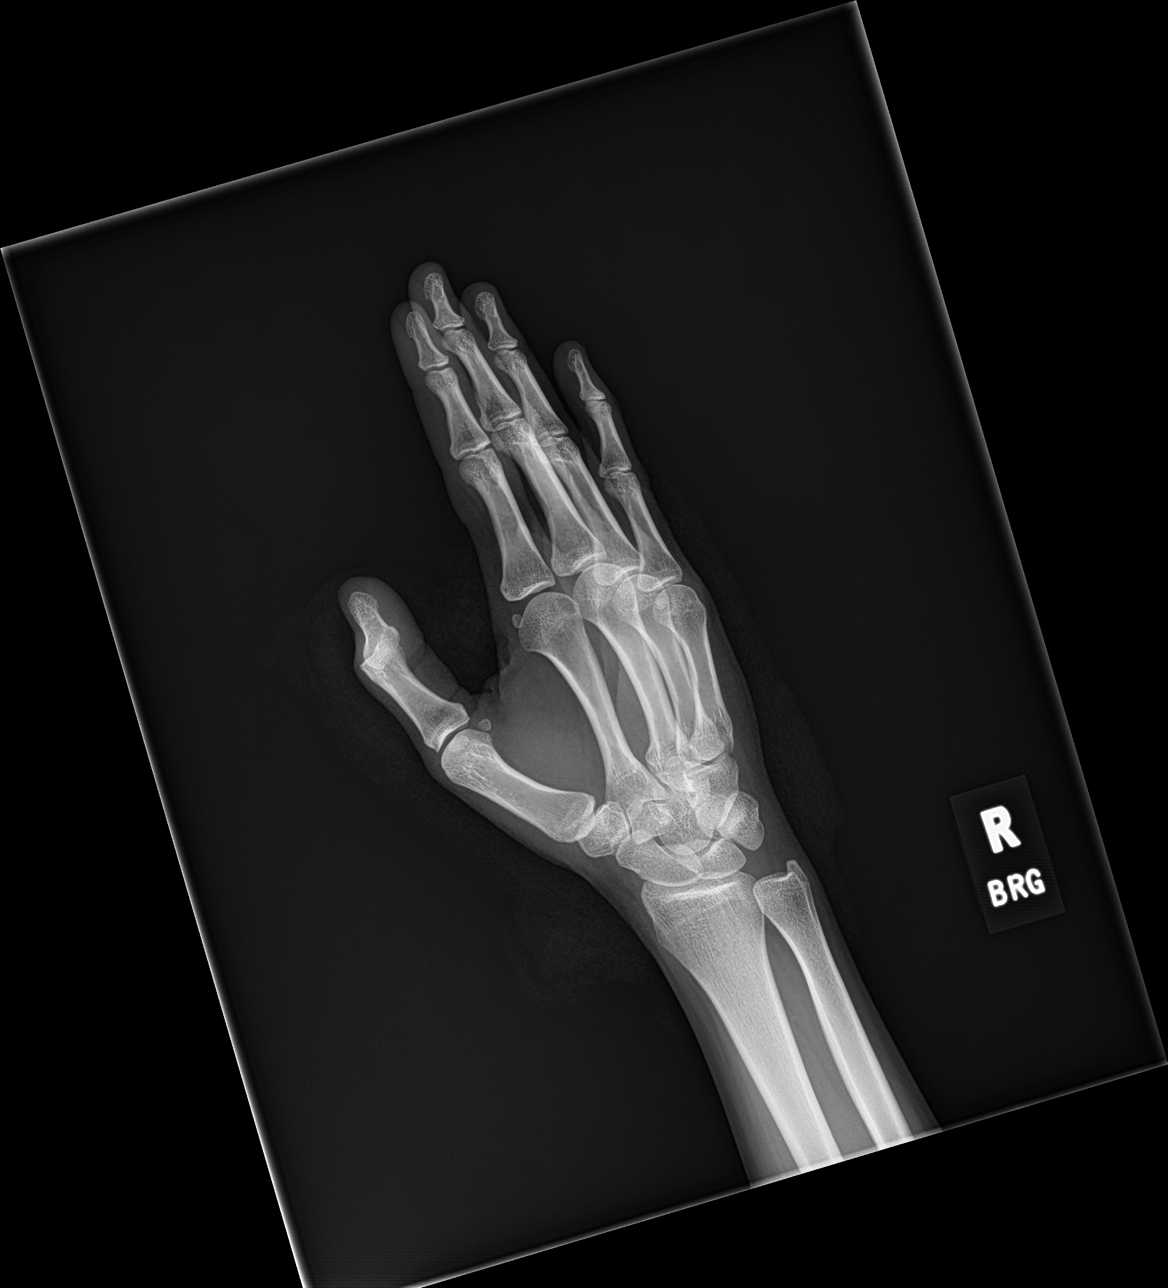

[hand lat]
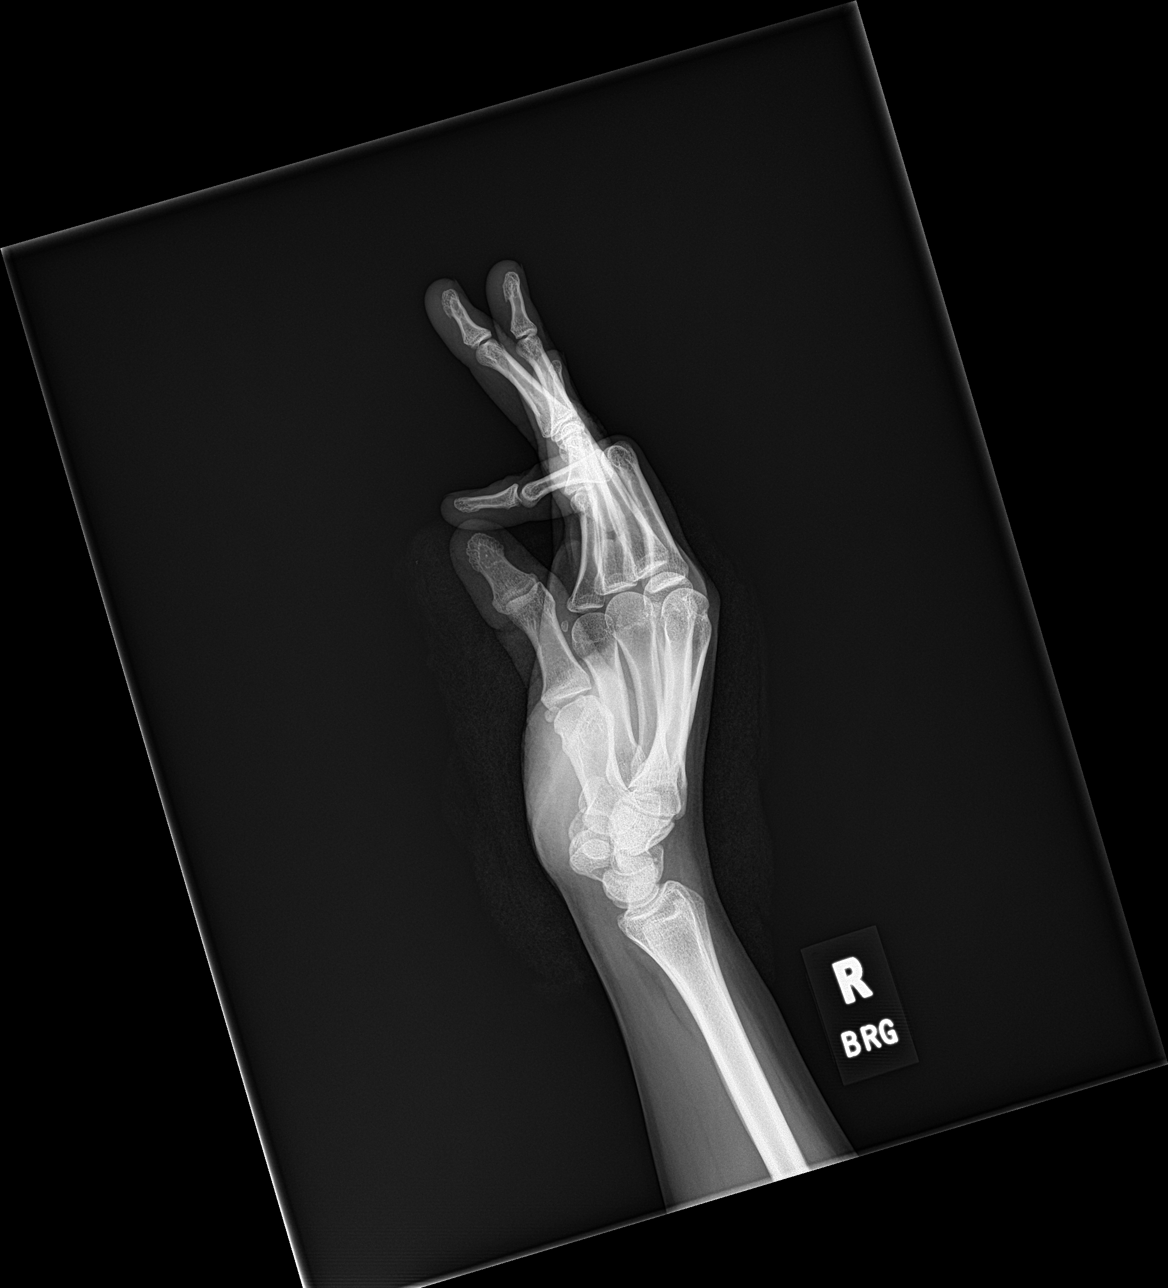

[3 of 3 positions shown; findings below may reference images not displayed]

FINDINGS: There is no evidence of fracture or dislocation. There is no
evidence of arthropathy or other focal bone abnormality. Soft
tissues are unremarkable.
IMPRESSION: Negative.

## 2022-05-20 ENCOUNTER — Emergency Department (HOSPITAL_COMMUNITY): Payer: No Typology Code available for payment source

## 2022-05-20 ENCOUNTER — Emergency Department (HOSPITAL_COMMUNITY)
Admission: EM | Admit: 2022-05-20 | Discharge: 2022-05-20 | Disposition: A | Discharge status: Home / Self Care | Payer: No Typology Code available for payment source | Attending: Emergency Medicine | Admitting: Emergency Medicine

## 2022-05-20 DIAGNOSIS — Z91010 Allergy to peanuts: Secondary | ICD-10-CM | POA: Diagnosis not present

## 2022-05-20 DIAGNOSIS — S060X0A Concussion without loss of consciousness, initial encounter: Secondary | ICD-10-CM | POA: Diagnosis not present

## 2022-05-20 DIAGNOSIS — S0081XA Abrasion of other part of head, initial encounter: Secondary | ICD-10-CM | POA: Diagnosis not present

## 2022-05-20 DIAGNOSIS — S20219A Contusion of unspecified front wall of thorax, initial encounter: Secondary | ICD-10-CM

## 2022-05-20 DIAGNOSIS — S20213A Contusion of bilateral front wall of thorax, initial encounter: Secondary | ICD-10-CM | POA: Insufficient documentation

## 2022-05-20 DIAGNOSIS — S8001XA Contusion of right knee, initial encounter: Secondary | ICD-10-CM

## 2022-05-20 DIAGNOSIS — Y92410 Unspecified street and highway as the place of occurrence of the external cause: Secondary | ICD-10-CM | POA: Insufficient documentation

## 2022-05-20 DIAGNOSIS — S0990XA Unspecified injury of head, initial encounter: Secondary | ICD-10-CM | POA: Diagnosis present

## 2022-05-20 MED ORDER — ACETAMINOPHEN 500 MG PO TABS
1000.0000 mg | ORAL_TABLET | Freq: Once | ORAL | Status: AC
  Administered 2022-05-20: 1000 mg via ORAL
  Filled 2022-05-20: qty 2

## 2022-05-20 MED ORDER — NAPROXEN 500 MG PO TABS: 500.0000 mg | ORAL_TABLET | Freq: Two times a day (BID) | ORAL | 0 refills | Status: DC
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
## 2022-05-20 NOTE — Discharge Instructions (Signed)
Bacitracin to the wounds, Naprosyn twice a day for pain, all of your x-rays were reassuring and nothing is broken.  ER for worsening symptoms  Please take Naprosyn, 500mg  by mouth twice daily as needed for pain - this in an antiinflammatory medicine (NSAID) and is similar to ibuprofen - many people feel that it is stronger than ibuprofen and it is easier to take since it is a smaller pill.  Please use this only for 1 week - if your pain persists, you will need to follow up with your doctor in the office for ongoing guidance and pain control.  Thank you for allowing [place] to treat you in the emergency department today.  After reviewing your examination and potential testing that was done it appears that you are safe to go home.  I would like for you to follow-up with your doctor within the next several days, have them obtain your results and follow-up with them to review all of these tests.  If you should develop severe or worsening symptoms return to the emergency department immediately
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
## 2022-06-22 ENCOUNTER — Encounter: Payer: Self-pay | Admitting: Family Medicine

## 2022-06-22 ENCOUNTER — Ambulatory Visit (INDEPENDENT_AMBULATORY_CARE_PROVIDER_SITE_OTHER): Payer: Self-pay | Admitting: Family Medicine

## 2022-06-22 VITALS — BP 109/69 | HR 84 | Ht 65.0 in | Wt 168.1 lb

## 2022-06-22 DIAGNOSIS — M62838 Other muscle spasm: Secondary | ICD-10-CM | POA: Insufficient documentation

## 2022-06-22 DIAGNOSIS — E0789 Other specified disorders of thyroid: Secondary | ICD-10-CM

## 2022-06-22 DIAGNOSIS — E559 Vitamin D deficiency, unspecified: Secondary | ICD-10-CM

## 2022-06-22 DIAGNOSIS — Z23 Encounter for immunization: Secondary | ICD-10-CM

## 2022-06-22 DIAGNOSIS — H539 Unspecified visual disturbance: Secondary | ICD-10-CM | POA: Insufficient documentation

## 2022-06-22 DIAGNOSIS — R7301 Impaired fasting glucose: Secondary | ICD-10-CM

## 2022-06-22 DIAGNOSIS — E7849 Other hyperlipidemia: Secondary | ICD-10-CM

## 2022-06-22 MED ORDER — TIZANIDINE HCL 4 MG PO CAPS: 4.0000 mg | ORAL_CAPSULE | Freq: Three times a day (TID) | ORAL | 1 refills | Status: DC
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
## 2022-06-22 NOTE — Patient Instructions (Addendum)
I appreciate the opportunity to provide care to you today!    Follow up:  1 month for pap smear  Labs: please stop by the lab during the week to get your blood drawn (CBC, CMP, TSH, Lipid profile, HgA1c, Vit D)  Usually, it takes 1-2 weeks to heal from a muscle strain. Full healing normally takes 5-6 weeks.  I recommend rest, alternating with ice and heat therapy at the affected site and over-the-counter analgesics as as ibuprofen Apply heat to the affected area such as a moist heat pack or a heating pad. Place a towel between your skin and the heat source. Leave the heat on for 20-30 minutes. Remove the heat if your skin turns bright red. This is especially important if you are unable to feel pain, heat, or cold. You may have a greater risk of getting burned. Apply  ice on the painful area. To do this: If you have a removable splint, remove it as told by your health care provider. Put ice in a plastic bag. Place a towel between your skin and the bag or between your splint and the bag. Leave the ice on for 20 minutes, 2-3 times a day.     Please pick up your prescription at the pharmacy I taking tizanidine at bedtime to decrease the risk for drowsiness during the day   Referrals today-  Ophthalmology    Please continue to a heart-healthy diet and increase your physical activities. Try to exercise for at least five days a week.      It was a pleasure to see you and I look forward to continuing to work together on your health and well-being. Please do not hesitate to call the office if you need care or have questions about your care.   Have a wonderful day and week. With Gratitude, Gilmore Laroche MSN, FNP-BC
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
## 2022-07-19 ENCOUNTER — Ambulatory Visit
Admission: RE | Admit: 2022-07-19 | Discharge: 2022-07-19 | Disposition: A | Discharge status: Home / Self Care | Payer: Self-pay | Source: Ambulatory Visit | Attending: Family Medicine | Admitting: Family Medicine

## 2022-07-19 ENCOUNTER — Ambulatory Visit: Payer: Medicaid Other

## 2022-07-19 VITALS — BP 116/72 | HR 79 | Temp 97.9°F | Resp 16

## 2022-07-19 DIAGNOSIS — Z113 Encounter for screening for infections with a predominantly sexual mode of transmission: Secondary | ICD-10-CM | POA: Insufficient documentation

## 2022-07-19 NOTE — ED Provider Notes (Signed)
[facility]    CSN: [id number] Arrival date & time: 07/19/22  1608      History   Chief Complaint Chief Complaint  Patient presents with   Exposure to STD    Just need an updated std check up - Entered by patient    HPI Christina Shah is a 27 y.o. female.   Patient presenting today requesting screening for sexually transmitted infections.  Denies any new exposures, symptoms today.    Past Medical History:  Diagnosis Date   Food allergy    Strep throat     Patient Active Problem List   Diagnosis Date Noted   Visual disturbance 06/22/2022   Muscle spasm 06/22/2022   Encounter to establish care 08/21/2020   Cough 08/21/2020   Food allergy 08/21/2020   Birth control counseling 08/21/2020    Past Surgical History:  Procedure Laterality Date   WISDOM TOOTH EXTRACTION      OB History     Gravida  0   Para  0   Term  0   Preterm  0   AB  0   Living  0      SAB  0   IAB  0   Ectopic  0   Multiple  0   Live Births  0            Home Medications    Prior to Admission medications   Medication Sig Start Date End Date Taking? Authorizing Provider  Multiple Vitamin (MULTIVITAMIN ADULT PO) Take by mouth.   Yes [provider]  Multiple Vitamins-Minerals (HAIR SKIN AND NAILS FORMULA PO) Take by mouth.   Yes [provider]  tiZANidine (ZANAFLEX) 4 MG capsule Take 1 capsule (4 mg total) by mouth 3 (three) times daily. 06/22/22  Yes Gilmore Laroche, FNP  albuterol (VENTOLIN HFA) 108 (90 Base) MCG/ACT inhaler Inhale 1-2 puffs into the lungs every 4 (four) hours as needed for wheezing or shortness of breath. 08/21/20   Heather Roberts, NP  EPINEPHrine 0.3 mg/0.3 mL IJ SOAJ injection Inject 0.3 mg into the muscle as needed. Patient not taking: Reported on 06/22/2022 08/21/20   Heather Roberts, NP    Family History Family History  Problem Relation Age of Onset   Diabetes Maternal Grandmother    Cancer Father     Hypertension Father    Hypertension Mother     Social History Social History   Tobacco Use   Smoking status: Never   Smokeless tobacco: Never  Vaping Use   Vaping Use: Never used  Substance Use Topics   Alcohol use: Yes    Alcohol/week: 2.0 standard drinks of alcohol    Types: 2 Glasses of wine per week    Comment: occ   Drug use: No     Allergies   Pineapple, Other, Peanuts [peanut oil], and Peanut-containing drug products   Review of Systems Review of Systems Per HPI  Physical Exam Triage Vital Signs ED Triage Vitals  Enc Vitals Group     BP 07/19/22 1640 116/72     Pulse Rate 07/19/22 1640 79     Resp 07/19/22 1640 16     Temp 07/19/22 1640 97.9 F (36.6 C)     Temp Source 07/19/22 1640 Oral     SpO2 07/19/22 1640 98 %     Weight --      Height --      Head Circumference --      Peak  Flow --      Pain Score 07/19/22 1641 0     Pain Loc --      Pain Edu? --      Excl. in GC? --    No data found.  Updated Vital Signs BP 116/72 (BP Location: Right Arm)   Pulse 79   Temp 97.9 F (36.6 C) (Oral)   Resp 16   LMP 07/05/2022 (Exact Date)   SpO2 98%   Visual Acuity Right Eye Distance:   Left Eye Distance:   Bilateral Distance:    Right Eye Near:   Left Eye Near:    Bilateral Near:     Physical Exam Vitals and nursing note reviewed.  Constitutional:      Appearance: Normal appearance. She is not ill-appearing.  HENT:     Head: Atraumatic.  Eyes:     Extraocular Movements: Extraocular movements intact.     Conjunctiva/sclera: Conjunctivae normal.  Cardiovascular:     Rate and Rhythm: Normal rate and regular rhythm.     Heart sounds: Normal heart sounds.  Pulmonary:     Effort: Pulmonary effort is normal.     Breath sounds: Normal breath sounds.  Genitourinary:    Comments: GU exam deferred, self swab performed Musculoskeletal:        General: Normal range of motion.     Cervical back: Normal range of motion and neck supple.  Skin:     General: Skin is warm and dry.  Neurological:     Mental Status: She is alert and oriented to person, place, and time.  Psychiatric:        Mood and Affect: Mood normal.        Thought Content: Thought content normal.        Judgment: Judgment normal.      UC Treatments / Results  Labs (all labs ordered are listed, but only abnormal results are displayed) Labs Reviewed  HIV ANTIBODY (ROUTINE TESTING W REFLEX)  RPR  CERVICOVAGINAL ANCILLARY ONLY    EKG   Radiology No results found.  Procedures Procedures (including critical care time)  Medications Ordered in UC Medications - No data to display  Initial Impression / Assessment and Plan / UC Course  I have reviewed the triage vital signs and the nursing notes.  Pertinent labs & imaging results that were available during my care of the patient were reviewed by me and considered in my medical decision making (see chart for details).     Vaginal swab pending, HIV and syphilis labs also pending.  Treat based on results.  Final Clinical Impressions(s) / UC Diagnoses   Final diagnoses:  Screening examination for STI   Discharge Instructions   None    ED Prescriptions   None    PDMP not reviewed this encounter.   Particia Nearing, [place] 07/19/22 [phone]

## 2022-07-19 NOTE — ED Triage Notes (Signed)
Pt would like to be tested for STD, no recent exposure or symptoms but would like to be checked.

## 2022-07-20 ENCOUNTER — Telehealth: Payer: Self-pay | Admitting: Emergency Medicine

## 2022-07-20 LAB — CERVICOVAGINAL ANCILLARY ONLY
Bacterial Vaginitis (gardnerella): NEGATIVE
Candida Glabrata: NEGATIVE
Candida Vaginitis: POSITIVE — AB
Chlamydia: POSITIVE — AB
Comment: NEGATIVE
Comment: NEGATIVE
Comment: NEGATIVE
Comment: NEGATIVE
Comment: NEGATIVE
Comment: NORMAL
Neisseria Gonorrhea: NEGATIVE
Trichomonas: NEGATIVE

## 2022-07-20 MED ORDER — FLUCONAZOLE 150 MG PO TABS: 150.0000 mg | ORAL_TABLET | Freq: Once | ORAL | 0 refills | Status: AC

## 2022-07-20 MED ORDER — DOXYCYCLINE HYCLATE 100 MG PO CAPS: 100.0000 mg | ORAL_CAPSULE | Freq: Two times a day (BID) | ORAL | 0 refills | Status: DC

## 2022-07-21 ENCOUNTER — Telehealth: Payer: Self-pay | Admitting: Family Medicine

## 2022-07-21 ENCOUNTER — Telehealth: Payer: Self-pay

## 2022-07-21 ENCOUNTER — Telehealth (HOSPITAL_COMMUNITY): Payer: Self-pay | Admitting: Emergency Medicine

## 2022-07-21 MED ORDER — FLUCONAZOLE 150 MG PO TABS: 150.0000 mg | ORAL_TABLET | Freq: Every day | ORAL | 0 refills | Status: DC

## 2022-07-21 MED ORDER — DOXYCYCLINE HYCLATE 100 MG PO CAPS: 100.0000 mg | ORAL_CAPSULE | Freq: Two times a day (BID) | ORAL | 0 refills | Status: AC

## 2022-07-21 NOTE — Telephone Encounter (Signed)
Opened in error

## 2022-07-21 NOTE — Telephone Encounter (Signed)
Sending Rx to new pharmacy

## 2022-07-21 NOTE — Telephone Encounter (Signed)
Pt requesting rx to be sent to different pharmacy. Current preferred pharmacy is temporarily closed. Msg routed to provider on staff.

## 2022-07-26 ENCOUNTER — Ambulatory Visit: Payer: Medicaid Other | Admitting: Family Medicine

## 2022-07-28 ENCOUNTER — Other Ambulatory Visit: Payer: Self-pay | Admitting: Neurology

## 2022-07-28 MED ORDER — AMPHETAMINE-DEXTROAMPHETAMINE 10 MG PO TABS: 10.0000 mg | ORAL_TABLET | Freq: Every day | ORAL | 0 refills | Status: DC

## 2022-08-12 ENCOUNTER — Encounter: Payer: Self-pay | Admitting: Advanced Practice Midwife

## 2022-08-12 ENCOUNTER — Other Ambulatory Visit (HOSPITAL_COMMUNITY): Admission: RE | Admit: 2022-08-12 | Payer: Medicaid Other | Source: Ambulatory Visit

## 2022-08-12 ENCOUNTER — Ambulatory Visit (INDEPENDENT_AMBULATORY_CARE_PROVIDER_SITE_OTHER): Payer: Medicaid Other | Admitting: Advanced Practice Midwife

## 2022-08-12 VITALS — BP 104/67 | HR 90 | Ht 65.0 in | Wt 170.0 lb

## 2022-08-12 DIAGNOSIS — Z0000 Encounter for general adult medical examination without abnormal findings: Secondary | ICD-10-CM

## 2022-08-12 DIAGNOSIS — Z113 Encounter for screening for infections with a predominantly sexual mode of transmission: Secondary | ICD-10-CM

## 2022-08-12 DIAGNOSIS — Z01419 Encounter for gynecological examination (general) (routine) without abnormal findings: Secondary | ICD-10-CM | POA: Diagnosis present

## 2022-08-12 MED ORDER — FLUCONAZOLE 150 MG PO TABS: ORAL_TABLET | ORAL | 2 refills | Status: DC

## 2022-08-12 MED ORDER — PRENATAL VITAMIN 27-0.8 MG PO TABS: 1.0000 | ORAL_TABLET | Freq: Every day | ORAL | 3 refills | Status: DC

## 2022-08-12 NOTE — Progress Notes (Signed)
Christina Shah 27 y.o.  Vitals:   08/12/22 1453  BP: 104/67  Pulse: 90     Filed Weights   08/12/22 1453  Weight: 170 lb (77.1 kg)    Past Medical History: Past Medical History:  Diagnosis Date   Food allergy    Strep throat     Past Surgical History: Past Surgical History:  Procedure Laterality Date   WISDOM TOOTH EXTRACTION      Family History: Family History  Problem Relation Age of Onset   Diabetes Maternal Grandmother    Cancer Father    Hypertension Father    Hypertension Mother     Social History: Social History   Tobacco Use   Smoking status: Never   Smokeless tobacco: Never  Vaping Use   Vaping status: Never Used  Substance Use Topics   Alcohol use: Yes    Alcohol/week: 2.0 standard drinks of alcohol    Types: 2 Glasses of wine per week    Comment: occ   Drug use: No    Allergies:  Allergies  Allergen Reactions   Pineapple Anaphylaxis    Fresh pineapple   Other Hives, Itching and Swelling   Peanuts [Peanut Oil]    Peanut-Containing Drug Products Hives and Itching      Current Outpatient Medications:    albuterol (VENTOLIN HFA) 108 (90 Base) MCG/ACT inhaler, Inhale 1-2 puffs into the lungs every 4 (four) hours as needed for wheezing or shortness of breath., Disp: 1 each, Rfl: 0   amphetamine-dextroamphetamine (ADDERALL) 10 MG tablet, Take 1 tablet (10 mg total) by mouth daily with breakfast., Disp: 60 tablet, Rfl: 0   Multiple Vitamin (MULTIVITAMIN ADULT PO), Take by mouth., Disp: , Rfl:   History of Present Illness: Here for pap and physical.  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 No treatment  Periods are fine.  Uses condoms "a lot of the time" for contraception. OK if got pregnant.  Went to ED a few weeks ago for STD testing.  + for CHL, no bloodwork done. They couldn't find a vein  Prior cytology:   No results found for: "DIAGPAP", "HPVHIGH", "ADEQPAP"    Review of Systems   Patient denies any headaches, blurred vision, shortness of  breath, chest pain, abdominal pain, problems with bowel movements, urination, or intercourse.   Physical Exam: General:  Well developed, well nourished, no acute distress Skin:  Warm and dry Neck:  Midline trachea Lungs; Clear to auscultation bilaterally Breast:  No dominant palpable mass, retraction, or nipple discharge Cardiovascular: Regular rate and rhythm Abdomen:  Soft, non tender, no hepatosplenomegaly Pelvic:  External genitalia is normal in appearance.  The vagina is normal in appearance.  The cervix is nulliparous  Uterus is felt to be normal size, shape, and contour.  No adnexal masses or tenderness noted. Extremities:  No swelling or varicosities noted Psych:  No mood changes.     Impression: Normal GYN exam  STD screening       Plan: If pap normal, repeat in 3 years  Can try probioitics for frequent yeast infection PNV

## 2022-08-13 LAB — HIV ANTIBODY (ROUTINE TESTING W REFLEX): HIV Screen 4th Generation wRfx: NONREACTIVE

## 2022-08-13 LAB — RPR: RPR Ser Ql: NONREACTIVE

## 2022-08-16 LAB — CYTOLOGY - PAP
Comment: NEGATIVE
Diagnosis: NEGATIVE
High risk HPV: NEGATIVE

## 2022-08-23 ENCOUNTER — Ambulatory Visit: Payer: Medicaid Other | Admitting: Family Medicine

## 2022-08-25 ENCOUNTER — Ambulatory Visit (INDEPENDENT_AMBULATORY_CARE_PROVIDER_SITE_OTHER): Payer: Self-pay | Admitting: Family Medicine

## 2022-08-25 ENCOUNTER — Encounter: Payer: Self-pay | Admitting: Family Medicine

## 2022-08-25 VITALS — BP 109/72 | HR 90 | Resp 16 | Ht 65.0 in | Wt 169.0 lb

## 2022-08-25 DIAGNOSIS — R059 Cough, unspecified: Secondary | ICD-10-CM

## 2022-08-25 DIAGNOSIS — M62838 Other muscle spasm: Secondary | ICD-10-CM

## 2022-08-25 DIAGNOSIS — E7849 Other hyperlipidemia: Secondary | ICD-10-CM

## 2022-08-25 DIAGNOSIS — E559 Vitamin D deficiency, unspecified: Secondary | ICD-10-CM

## 2022-08-25 DIAGNOSIS — R7301 Impaired fasting glucose: Secondary | ICD-10-CM

## 2022-08-25 DIAGNOSIS — E038 Other specified hypothyroidism: Secondary | ICD-10-CM

## 2022-08-25 MED ORDER — BACLOFEN 10 MG PO TABS: 10.0000 mg | ORAL_TABLET | Freq: Every evening | ORAL | 1 refills | Status: DC | PRN

## 2022-08-25 MED ORDER — ALBUTEROL SULFATE HFA 108 (90 BASE) MCG/ACT IN AERS: 1.0000 | INHALATION_SPRAY | RESPIRATORY_TRACT | 1 refills | Status: AC | PRN

## 2022-08-25 NOTE — Patient Instructions (Addendum)
I appreciate the opportunity to provide care to you today!    Follow up:  3 months  Labs: please stop by the lab during the week to get your blood drawn (CBC, CMP, TSH, Lipid profile, HgA1c, Vit D)  -Please start taking Baclofen 10 mg at bedtime to help manage your neck and back pain. Additionally, I recommend performing the prescribed neck and back exercises to further alleviate your symptoms and improve mobility   Nonpharmacological interventions for managing neck pain can help alleviate discomfort, improve function, and prevent recurrence. Here are several effective strategies:  Posture Correction Ergonomic Adjustments: Ensure your workstation is set up to promote good posture. Your monitor should be at eye level, and your chair should support your lower back. Posture Awareness: Practice good posture by keeping your head aligned with your spine and avoiding slouching. Stretching and Strengthening Exercises Neck Stretches: Perform gentle neck stretches to increase flexibility and reduce muscle tension. Examples include chin tucks, neck rotations, and side bends. Strengthening Exercises: Incorporate exercises to strengthen neck and upper back muscles. Exercises like shoulder blade squeezes and resisted neck extensions can be beneficial. Heat and Cold Therapy Heat Therapy: Apply a warm compress or heating pad to the neck for 15-20 minutes to relax muscles and alleviate pain. Cold Therapy: Use an ice pack wrapped in a cloth on the neck for 15-20 minutes to reduce inflammation and numb discomfort, especially if the pain is due to a recent injury. Massage Therapy Self-Massage: Gently massage the neck and shoulder areas to reduce muscle tension and improve circulation. Professional Massage: Consider seeing a licensed massage therapist for targeted treatment of neck pain. Proper Sleep Practices Pillow Support: Use a supportive pillow that maintains the natural curve of your neck. Avoid sleeping  on overly soft or high pillows. Sleep Position: Sleep on your back or side rather than your stomach to avoid straining the neck. Ergonomic Adjustments Workstation Setup: Adjust your chair, desk, and computer setup to promote good posture and reduce strain on the neck. Adjustable Equipment: Use adjustable desks and ergonomic chairs to support proper alignment and reduce neck stress. Relaxation Techniques Deep Breathing: Practice deep breathing exercises to reduce stress and muscle tension in the neck. Progressive Muscle Relaxation: Use progressive muscle relaxation techniques to alleviate overall muscle tension. Physical Therapy Professional Therapy: Consult a physical therapist for a personalized exercise and stretching program. They can also provide manual therapy and postural training. Activity Modification Avoid Overuse: Take breaks and avoid repetitive activities that strain the neck. Use proper body mechanics when lifting or performing tasks. Adjust Activities: Modify activities that exacerbate neck pain, such as prolonged computer use or heavy lifting. Hydration and Nutrition Stay Hydrated: Drink plenty of water to maintain muscle hydration and function. Balanced Diet: Eat a balanced diet rich in nutrients that support muscle health, such as calcium and vitamin D.  Attached with your AVS, you will find valuable resources for self-education. I highly recommend dedicating some time to thoroughly examine them.   Please continue to a heart-healthy diet and increase your physical activities. Try to exercise for at least five days a week.    It was a pleasure to see you and I look forward to continuing to work together on your health and well-being. Please do not hesitate to call the office if you need care or have questions about your care.  In case of emergency, please visit the Emergency Department for urgent care, or contact our clinic at [phone] to schedule an appointment.  We're here to help you!   Have a wonderful day and week. With Gratitude, Gilmore Laroche MSN, FNP-BC
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
## 2022-08-29 NOTE — Assessment & Plan Note (Signed)
Refilled baclofen 10 mg at bedtime Encouraged supportive care with rest, stretching exercises, heat application, activity modification and posture correction

## 2022-08-31 ENCOUNTER — Ambulatory Visit: Payer: Self-pay | Admitting: Family Medicine

## 2022-10-26 ENCOUNTER — Encounter: Payer: Self-pay | Admitting: Family Medicine

## 2022-10-26 ENCOUNTER — Ambulatory Visit (INDEPENDENT_AMBULATORY_CARE_PROVIDER_SITE_OTHER): Payer: Self-pay | Admitting: Family Medicine

## 2022-10-26 VITALS — BP 123/73 | HR 100 | Ht 65.0 in | Wt 172.1 lb

## 2022-10-26 DIAGNOSIS — M62838 Other muscle spasm: Secondary | ICD-10-CM

## 2022-10-26 DIAGNOSIS — M545 Low back pain, unspecified: Secondary | ICD-10-CM

## 2022-10-26 MED ORDER — BACLOFEN 10 MG PO TABS
10.0000 mg | ORAL_TABLET | Freq: Every evening | ORAL | 1 refills | Status: DC | PRN
Start: 2022-10-26 — End: 2023-09-29

## 2022-10-26 NOTE — Patient Instructions (Addendum)
I appreciate the opportunity to provide care to you today!    Follow up:  5 months  Fasting Labs: please stop by the lab today/during the week to get your blood drawn (CBC, CMP, TSH, Lipid profile, HgA1c, Vit D)-orders placed from last visit  Please stop by [facility] to get a x-ray of your lower back  Here are several nonpharmacological interventions for managing neck and back pain: -Physical Activity: Engage in regular low-impact exercises such as walking, swimming, or cycling to strengthen the back muscles and improve flexibility. -Stretching and Strengthening Exercises: Incorporate exercises that focus on strengthening the core and back muscles, as well as stretches to improve flexibility. Specific exercises can include: Cat-Cow stretch Child's pose Knee-to-chest stretch Hamstring stretches Hot and Cold Therapy: -Cold Therapy: Apply ice packs to the affected area for 15-20 minutes to reduce inflammation and numb the pain -Heat Therapy:  use heat packs or warm baths to relax tight muscles and improve circulation. -Posture Improvement: Maintain proper posture while sitting, standing, and lifting to avoid additional strain on the neck and  back. Ergonomic chairs and lumbar support can help. -Weight Management: Maintain a healthy weight to reduce stress on the spine and lower back. -Sleep Hygiene: Ensure proper sleep positioning by using supportive pillows and mattresses to maintain spinal alignment during sleep.   Referrals today- Orthopedics surgery  Attached with your AVS, you will find valuable resources for self-education. I highly recommend dedicating some time to thoroughly examine them.   Please continue to a heart-healthy diet and increase your physical activities. Try to exercise for at least five days a week.    It was a pleasure to see you and I look forward to continuing to work together on your health and well-being. Please do not hesitate to call the  office if you need care or have questions about your care.  In case of emergency, please visit the Emergency Department for urgent care, or contact our clinic at [phone] to schedule an appointment. We're here to help you!   Have a wonderful day and week. With Gratitude, Gilmore Laroche MSN, FNP-BC
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
## 2022-10-29 DIAGNOSIS — M545 Low back pain, unspecified: Secondary | ICD-10-CM | POA: Insufficient documentation

## 2022-10-29 NOTE — Assessment & Plan Note (Signed)
An imaging study of her lower back will be obtained. Encouraged to continue taking her muscle relaxant and over-the-counter analgesics for pain relief. Several nonpharmacological interventions for managing neck and back pain were reviewed. Due to work constraints, the patient declines a referral to physical therapy at this time. A referral has been placed to orthopedic surgery for collaborative care.  Here are several nonpharmacological interventions for managing neck and back pain: -Physical Activity: Engage in regular low-impact exercises such as walking, swimming, or cycling to strengthen the back muscles and improve flexibility. -Stretching and Strengthening Exercises: Incorporate exercises that focus on strengthening the core and back muscles, as well as stretches to improve flexibility. Specific exercises can include: Cat-Cow stretch Child's pose Knee-to-chest stretch Hamstring stretches Hot and Cold Therapy: -Cold Therapy: Apply ice packs to the affected area for 15-20 minutes to reduce inflammation and numb the pain -Heat Therapy:  use heat packs or warm baths to relax tight muscles and improve circulation. -Posture Improvement: Maintain proper posture while sitting, standing, and lifting to avoid additional strain on the neck and  back. Ergonomic chairs and lumbar support can help. -Weight Management: Maintain a healthy weight to reduce stress on the spine and lower back. -Sleep Hygiene: Ensure proper sleep positioning by using supportive pillows and mattresses to maintain spinal alignment during sleep.

## 2022-12-02 ENCOUNTER — Other Ambulatory Visit: Payer: Self-pay | Admitting: Neurology

## 2022-12-02 MED ORDER — AMPHETAMINE-DEXTROAMPHETAMINE 10 MG PO TABS: 10.0000 mg | ORAL_TABLET | Freq: Two times a day (BID) | ORAL | 0 refills | Status: DC

## 2022-12-03 ENCOUNTER — Ambulatory Visit: Payer: Medicaid Other | Admitting: Family Medicine

## 2022-12-07 ENCOUNTER — Ambulatory Visit: Payer: Medicaid Other

## 2022-12-08 ENCOUNTER — Other Ambulatory Visit: Payer: Self-pay | Admitting: Neurology

## 2022-12-16 ENCOUNTER — Other Ambulatory Visit (HOSPITAL_COMMUNITY)
Admission: RE | Admit: 2022-12-16 | Discharge: 2022-12-16 | Disposition: A | Discharge status: Home / Self Care | Payer: Medicaid Other | Source: Ambulatory Visit | Attending: Obstetrics & Gynecology | Admitting: Obstetrics & Gynecology

## 2022-12-16 ENCOUNTER — Other Ambulatory Visit (INDEPENDENT_AMBULATORY_CARE_PROVIDER_SITE_OTHER): Payer: Self-pay

## 2022-12-16 DIAGNOSIS — N760 Acute vaginitis: Secondary | ICD-10-CM | POA: Insufficient documentation

## 2022-12-16 DIAGNOSIS — B9689 Other specified bacterial agents as the cause of diseases classified elsewhere: Secondary | ICD-10-CM | POA: Diagnosis not present

## 2022-12-16 DIAGNOSIS — Z113 Encounter for screening for infections with a predominantly sexual mode of transmission: Secondary | ICD-10-CM | POA: Diagnosis present

## 2022-12-16 NOTE — Progress Notes (Signed)
   NURSE VISIT- STD  SUBJECTIVE:  Christina Shah is a 27 y.o. G0P0000 GYN patientfemale here for a vaginal swab for STD screen.  She reports the following symptoms: none. "Just switched partners" Denies abnormal vaginal bleeding, significant pelvic pain, fever, or UTI symptoms.  OBJECTIVE:  There were no vitals taken for this visit.  Appears well, in no apparent distress  ASSESSMENT: Vaginal swab for STD screen  PLAN: Self-collected vaginal probe for Gonorrhea, Chlamydia, Trichomonas, Bacterial Vaginosis, Yeast sent to lab Treatment: to be determined once results are received Follow-up as needed if symptoms persist/worsen, or new symptoms develop  Jobe Marker  12/16/2022 4:23 PM

## 2022-12-20 ENCOUNTER — Other Ambulatory Visit: Payer: Self-pay | Admitting: Adult Health

## 2022-12-20 LAB — CERVICOVAGINAL ANCILLARY ONLY
Bacterial Vaginitis (gardnerella): POSITIVE — AB
Candida Glabrata: NEGATIVE
Candida Vaginitis: NEGATIVE
Chlamydia: NEGATIVE
Comment: NEGATIVE
Comment: NEGATIVE
Comment: NEGATIVE
Comment: NEGATIVE
Comment: NEGATIVE
Comment: NORMAL
Neisseria Gonorrhea: NEGATIVE
Trichomonas: NEGATIVE

## 2022-12-20 MED ORDER — METRONIDAZOLE 500 MG PO TABS: 500.0000 mg | ORAL_TABLET | Freq: Two times a day (BID) | ORAL | 0 refills | Status: DC

## 2022-12-20 NOTE — Progress Notes (Signed)
+  BV on vaginal swab will rx flagyl,no sex or alcohol while taking  ?

## 2023-03-17 ENCOUNTER — Ambulatory Visit: Admitting: Adult Health

## 2023-03-21 ENCOUNTER — Ambulatory Visit: Admitting: Adult Health

## 2023-03-23 ENCOUNTER — Ambulatory Visit (INDEPENDENT_AMBULATORY_CARE_PROVIDER_SITE_OTHER): Payer: Self-pay | Admitting: *Deleted

## 2023-03-23 ENCOUNTER — Other Ambulatory Visit (HOSPITAL_COMMUNITY)
Admission: RE | Admit: 2023-03-23 | Discharge: 2023-03-23 | Disposition: A | Discharge status: Home / Self Care | Source: Ambulatory Visit | Attending: Obstetrics & Gynecology | Admitting: Obstetrics & Gynecology

## 2023-03-23 DIAGNOSIS — Z113 Encounter for screening for infections with a predominantly sexual mode of transmission: Secondary | ICD-10-CM | POA: Diagnosis present

## 2023-03-23 NOTE — Progress Notes (Signed)
   NURSE VISIT- VAGINITIS/STD/POC  SUBJECTIVE:  Christina Shah is a 28 y.o. G0P0000 GYN patientfemale here for a vaginal swab for STD screen.  She reports the following symptoms: none for 0 days. Denies abnormal vaginal bleeding, significant pelvic pain, fever, or UTI symptoms.  OBJECTIVE:  There were no vitals taken for this visit.  Appears well, in no apparent distress  ASSESSMENT: Vaginal swab for STD screen  PLAN: Self-collected vaginal probe for Gonorrhea, Chlamydia, Trichomonas, Bacterial Vaginosis, Yeast sent to lab Treatment: to be determined once results are received Follow-up as needed if symptoms persist/worsen, or new symptoms develop  Annamarie Dawley  03/23/2023 10:30 AM

## 2023-03-24 ENCOUNTER — Other Ambulatory Visit: Payer: Self-pay | Admitting: Adult Health

## 2023-03-24 LAB — CERVICOVAGINAL ANCILLARY ONLY
Bacterial Vaginitis (gardnerella): POSITIVE — AB
Candida Glabrata: NEGATIVE
Candida Vaginitis: NEGATIVE
Chlamydia: NEGATIVE
Comment: NEGATIVE
Comment: NEGATIVE
Comment: NEGATIVE
Comment: NEGATIVE
Comment: NEGATIVE
Comment: NORMAL
Neisseria Gonorrhea: NEGATIVE
Trichomonas: NEGATIVE

## 2023-03-24 MED ORDER — METRONIDAZOLE 500 MG PO TABS: 500.0000 mg | ORAL_TABLET | Freq: Two times a day (BID) | ORAL | 0 refills | Status: DC

## 2023-03-24 NOTE — Progress Notes (Signed)
+  BV on vaginal swab, rx flagyl 

## 2023-03-28 ENCOUNTER — Ambulatory Visit: Payer: Medicaid Other | Admitting: Family Medicine

## 2023-04-04 ENCOUNTER — Encounter: Payer: Self-pay | Admitting: Family Medicine

## 2023-06-24 ENCOUNTER — Ambulatory Visit (INDEPENDENT_AMBULATORY_CARE_PROVIDER_SITE_OTHER): Payer: Self-pay | Admitting: Family Medicine

## 2023-06-24 ENCOUNTER — Encounter: Payer: Self-pay | Admitting: Family Medicine

## 2023-06-24 VITALS — BP 105/69 | HR 84 | Resp 16 | Ht 65.0 in | Wt 160.8 lb

## 2023-06-24 DIAGNOSIS — E7849 Other hyperlipidemia: Secondary | ICD-10-CM

## 2023-06-24 DIAGNOSIS — R7301 Impaired fasting glucose: Secondary | ICD-10-CM

## 2023-06-24 DIAGNOSIS — E038 Other specified hypothyroidism: Secondary | ICD-10-CM

## 2023-06-24 DIAGNOSIS — L680 Hirsutism: Secondary | ICD-10-CM | POA: Insufficient documentation

## 2023-06-24 DIAGNOSIS — E559 Vitamin D deficiency, unspecified: Secondary | ICD-10-CM

## 2023-06-24 DIAGNOSIS — R41840 Attention and concentration deficit: Secondary | ICD-10-CM | POA: Insufficient documentation

## 2023-06-24 MED ORDER — BUPROPION HCL ER (XL) 150 MG PO TB24: 150.0000 mg | ORAL_TABLET | Freq: Every day | ORAL | 1 refills | Status: DC

## 2023-06-24 NOTE — Progress Notes (Signed)
 Established Patient Office Visit  Subjective:  Patient ID: Christina Shah, female    DOB: [birth year]  Age: 28 y.o. MRN: [id number]  CC:  Chief Complaint  Patient presents with   facial hair     Has been growing facial hair on her chin and jawline. Wants labs to check her vitamin levels     Fatigue   Lack of focus     Has been having trouble concentrating, focusing and procrastination. Has always been like that but its getting worse and interfering with her daily activities     HPI JADEE GOLEBIEWSKI is a 28 y.o. female  presents with the above concerns and a history of irregular menses. She reports the presence of facial hair along the jawline, which has progressively increased over the years. She endorses longstanding symptoms of inattentiveness, difficulty concentrating, and chronic procrastination dating back to childhood, though she has never received a definitive diagnosis of ADHD. She denies any suicidal thoughts or ideation.    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 Eats apples.    Wear seatbelt.   Exercise.   Lives alone.   Social Drivers of Corporate investment banker Strain: Low Risk  (08/12/2022)   Overall Financial Resource Strain (CARDIA)    Difficulty of Paying Living Expenses: Not very hard  Food Insecurity: No Food Insecurity (08/12/2022)   Hunger Vital Sign    Worried About Running Out of Food in the Last Year: Never true    Ran Out of Food in the Last Year: Never true  Transportation Needs: No Transportation Needs (08/12/2022)   PRAPARE - Administrator, Civil Service (Medical): No    Lack of Transportation (Non-Medical): No  Physical Activity: Insufficiently Active (08/12/2022)   Exercise Vital Sign    Days of Exercise per Week: 2 days    Minutes of Exercise per Session: 40 min  Stress: No Stress Concern Present (08/12/2022)   [employer] of Occupational Health - Occupational Stress Questionnaire    Feeling of Stress : Only a little  Social Connections: Moderately Isolated (08/12/2022)   Social Connection and Isolation Panel    Frequency of Communication with Friends and Family: More than three times a week  Frequency of Social Gatherings with Friends and Family: Three times a week    Attends Religious Services: 1 to 4 times per year    Active Member of Clubs or Organizations: No    Attends Banker Meetings: Never    Marital Status: Never married  Intimate Partner Violence: Not At Risk (08/12/2022)   Humiliation, Afraid, Rape, and Kick questionnaire    Fear of Current or Ex-Partner: No    Emotionally Abused: No    Physically Abused: No    Sexually Abused: No    Outpatient Medications Prior to Visit  Medication Sig Dispense Refill   albuterol  (VENTOLIN  HFA) 108 (90 Base) MCG/ACT inhaler Inhale 1-2 puffs into the lungs every 4 (four) hours as needed for wheezing or shortness of breath. 1 each 1   baclofen  (LIORESAL ) 10  MG tablet Take 1 tablet (10 mg total) by mouth at bedtime as needed for muscle spasms. 60 each 1   Prenatal Vit-Fe Fumarate-FA (PRENATAL VITAMIN) 27-0.8 MG TABS Take 1 tablet by mouth daily. 90 tablet 3   metroNIDAZOLE  (FLAGYL ) 500 MG tablet Take 1 tablet (500 mg total) by mouth 2 (two) times daily. 14 tablet 0   No facility-administered medications prior to visit.    Allergies  Allergen Reactions   Pineapple Anaphylaxis    Fresh pineapple   Other Hives, Itching and Swelling   Peanuts [Peanut Oil]    Peanut-Containing Drug Products Hives and Itching    ROS Review of Systems  Constitutional:  Positive for fatigue. Negative for chills and fever.  Eyes:  Negative for visual disturbance.  Respiratory:  Negative for chest tightness and shortness of breath.   Neurological:  Negative for dizziness and headaches.      Objective:    Physical Exam HENT:     Head: Normocephalic.     Mouth/Throat:     Mouth: Mucous membranes are moist.   Cardiovascular:     Rate and Rhythm: Normal rate.     Heart sounds: Normal heart sounds.  Pulmonary:     Effort: Pulmonary effort is normal.     Breath sounds: Normal breath sounds.   Neurological:     Mental Status: She is alert.     BP 105/69   Pulse 84   Resp 16   Ht 5' 5 (1.651 m)   Wt 160 lb 12.8 oz (72.9 kg)   SpO2 97%   BMI 26.76 kg/m  Wt Readings from Last 3 Encounters:  06/24/23 160 lb 12.8 oz (72.9 kg)  10/26/22 172 lb 1.9 oz (78.1 kg)  08/25/22 169 lb (76.7 kg)    Lab Results  Component Value Date   TSH 0.83 04/13/2017   Lab Results  Component Value Date   WBC 6.4 03/06/2020   HGB 13.9 03/06/2020   HCT 41.0 03/06/2020   MCV 89.0 03/06/2020   PLT 411 (H) 03/06/2020   Lab Results  Component Value Date   NA 136 03/06/2020   K 4.3 03/06/2020   CO2 23 03/06/2020   GLUCOSE 102 (H) 03/06/2020   BUN 9 03/06/2020   CREATININE 0.60 03/06/2020   BILITOT 0.6 04/13/2017   AST 19 04/13/2017   ALT 9 04/13/2017   PROT  7.5 04/13/2017   CALCIUM 9.3 03/06/2020   ANIONGAP 9 03/06/2020   Lab Results  Component Value Date   CHOL 165 04/13/2017   Lab Results  Component Value Date   HDL 87 04/13/2017   Lab Results  Component Value Date  LDLCALC 65 04/13/2017   Lab Results  Component Value Date   TRIG 44 04/13/2017   Lab Results  Component Value Date   CHOLHDL 1.9 04/13/2017   No results found for: HGBA1C    Assessment & Plan:  Attention deficit Assessment & Plan: Will initiate therapy with Wellbutrin 150 mg daily. Provided the patient with contact information and locations for neuropsychiatric testing to confirm an ADHD diagnosis. The patient will follow up in one month to assess response to treatment and review testing progress.   Orders: -     buPROPion HCl ER (XL); Take 1 tablet (150 mg total) by mouth daily.  Dispense: 30 tablet; Refill: 1  Hirsutism Assessment & Plan: Orders have been placed to rule out polycystic ovary syndrome (PCOS) and thyroid dysfunction as possible causes of facial hair growth, fatigue, and hair thinning.  Orders: -     Lipid panel -     Estradiol -     FSH/LH -     Testosterone,Free and Total -     Prolactin  IFG (impaired fasting glucose) -     Hemoglobin A1c  Vitamin D deficiency -     VITAMIN D 25 Hydroxy (Vit-D Deficiency, Fractures)  TSH (thyroid-stimulating hormone deficiency) -     TSH + free T4  Other hyperlipidemia -     Lipid panel -     CMP14+EGFR -     CBC with Differential/Platelet  Note: This chart has been completed using Engineer, civil (consulting) software, and while attempts have been made to ensure accuracy, certain words and phrases may not be transcribed as intended.    Follow-up: Return in about 1 month (around 07/24/2023).   Daleisa Halperin, FNP

## 2023-06-24 NOTE — Patient Instructions (Addendum)
 I appreciate the opportunity to provide care to you today!    Follow up:  1 month  Labs: please stop by the lab today to get your blood drawn   Attention Deficit:  Start Wellbutrin 150 mg daily to help improve focus, attention, and concentration.  I recommend follow-up at a neuropsychiatric facility for comprehensive testing to confirm a diagnosis of ADHD.  The following are recommended options for neuropsychological testing:  Elinda Guard, NP at [facility] - [phone]  [facility] - [phone] or [phone] (multiple providers available)   [facility] - [phone]  Louetta Roux, PhD - specializes in Adult ADHD  Shiela Dooms, PhD - treats adults with ADHD and bipolar disorder  Facial Hair:  Orders have been placed to rule out polycystic ovary syndrome (PCOS) and thyroid dysfunction as possible causes of facial hair growth, fatigue, and hair thinning.   Please continue to a heart-healthy diet and increase your physical activities. Try to exercise for at least five days a week.    It was a pleasure to see you and I look forward to continuing to work together on your health and well-being. Please do not hesitate to call the office if you need care or have questions about your care.  In case of emergency, please visit the Emergency Department for urgent care, or contact our clinic at [phone] to schedule an appointment. We're here to help you!   Have a wonderful day and week. With Gratitude, Sigfredo Schreier MSN, FNP-BC

## 2023-06-24 NOTE — Assessment & Plan Note (Signed)
 Orders have been placed to rule out polycystic ovary syndrome (PCOS) and thyroid dysfunction as possible causes of facial hair growth, fatigue, and hair thinning.

## 2023-06-24 NOTE — Assessment & Plan Note (Signed)
 Will initiate therapy with Wellbutrin 150 mg daily. Provided the patient with contact information and locations for neuropsychiatric testing to confirm an ADHD diagnosis. The patient will follow up in one month to assess response to treatment and review testing progress.

## 2023-06-25 LAB — CBC WITH DIFFERENTIAL/PLATELET
Basophils Absolute: 0.1 10*3/uL (ref 0.0–0.2)
Basos: 1 %
EOS (ABSOLUTE): 0.1 10*3/uL (ref 0.0–0.4)
Eos: 3 %
Hematocrit: 41.5 % (ref 34.0–46.6)
Hemoglobin: 13 g/dL (ref 11.1–15.9)
Immature Grans (Abs): 0 10*3/uL (ref 0.0–0.1)
Immature Granulocytes: 0 %
Lymphocytes Absolute: 2 10*3/uL (ref 0.7–3.1)
Lymphs: 42 %
MCH: 26.7 pg (ref 26.6–33.0)
MCHC: 31.3 g/dL — ABNORMAL LOW (ref 31.5–35.7)
MCV: 85 fL (ref 79–97)
Monocytes Absolute: 0.4 10*3/uL (ref 0.1–0.9)
Monocytes: 8 %
Neutrophils Absolute: 2.2 10*3/uL (ref 1.4–7.0)
Neutrophils: 46 %
Platelets: 317 10*3/uL (ref 150–450)
RBC: 4.86 x10E6/uL (ref 3.77–5.28)
RDW: 12.6 % (ref 11.7–15.4)
WBC: 4.7 10*3/uL (ref 3.4–10.8)

## 2023-06-25 LAB — CMP14+EGFR
ALT: 5 IU/L (ref 0–32)
AST: 14 IU/L (ref 0–40)
Albumin: 4.7 g/dL (ref 4.0–5.0)
Alkaline Phosphatase: 60 IU/L (ref 44–121)
BUN/Creatinine Ratio: 11 (ref 9–23)
BUN: 8 mg/dL (ref 6–20)
Bilirubin Total: 0.6 mg/dL (ref 0.0–1.2)
CO2: 21 mmol/L (ref 20–29)
Calcium: 9.7 mg/dL (ref 8.7–10.2)
Chloride: 104 mmol/L (ref 96–106)
Creatinine, Ser: 0.75 mg/dL (ref 0.57–1.00)
Globulin, Total: 2.9 g/dL (ref 1.5–4.5)
Glucose: 82 mg/dL (ref 70–99)
Potassium: 4.9 mmol/L (ref 3.5–5.2)
Sodium: 138 mmol/L (ref 134–144)
Total Protein: 7.6 g/dL (ref 6.0–8.5)
eGFR: 111 mL/min/{1.73_m2} (ref >59)

## 2023-06-25 LAB — HEMOGLOBIN A1C
Est. average glucose Bld gHb Est-mCnc: 103 mg/dL
Hgb A1c MFr Bld: 5.2 % (ref 4.8–5.6)

## 2023-06-25 LAB — TSH+FREE T4
Free T4: 1.43 ng/dL (ref 0.82–1.77)
TSH: 0.719 u[IU]/mL (ref 0.450–4.500)

## 2023-06-25 LAB — VITAMIN D 25 HYDROXY (VIT D DEFICIENCY, FRACTURES): Vit D, 25-Hydroxy: 37.8 ng/mL (ref 30.0–100.0)

## 2023-07-15 ENCOUNTER — Ambulatory Visit: Payer: Self-pay | Admitting: Family Medicine

## 2023-07-28 ENCOUNTER — Encounter: Payer: Self-pay | Admitting: Family Medicine

## 2023-08-01 ENCOUNTER — Ambulatory Visit: Payer: Self-pay | Admitting: Family Medicine

## 2023-08-02 ENCOUNTER — Ambulatory Visit (INDEPENDENT_AMBULATORY_CARE_PROVIDER_SITE_OTHER): Payer: Self-pay

## 2023-08-02 ENCOUNTER — Other Ambulatory Visit (HOSPITAL_COMMUNITY)
Admission: RE | Admit: 2023-08-02 | Discharge: 2023-08-02 | Disposition: A | Discharge status: Home / Self Care | Source: Ambulatory Visit | Attending: Obstetrics & Gynecology | Admitting: Obstetrics & Gynecology

## 2023-08-02 DIAGNOSIS — Z113 Encounter for screening for infections with a predominantly sexual mode of transmission: Secondary | ICD-10-CM | POA: Diagnosis present

## 2023-08-02 NOTE — Progress Notes (Signed)
   NURSE VISIT- VAGINITIS/STD  SUBJECTIVE:  Christina Shah is a 28 y.o. No obstetric history on file. GYN patientfemale here for a vaginal swab for vaginitis screening, STD screen.  She reports the following symptoms: none for 0 days. Denies abnormal vaginal bleeding, significant pelvic pain, fever, or UTI symptoms. Patient stated she wanted to be checked for everything to update her records  OBJECTIVE:  There were no vitals taken for this visit.  Appears well, in no apparent distress  ASSESSMENT: Vaginal swab for STD screen  PLAN: Self-collected vaginal probe for Gonorrhea, Chlamydia, Trichomonas, Bacterial Vaginosis, Yeast sent to lab Patient also sent for HIV, RPR, Hep C and Hep C labs to be drawn Treatment: to be determined once results are received Follow-up as needed if symptoms persist/worsen, or new symptoms develop  Aleck FORBES Blase  08/02/2023 3:44 PM

## 2023-08-04 ENCOUNTER — Ambulatory Visit: Payer: Self-pay | Admitting: Women's Health

## 2023-08-04 LAB — CERVICOVAGINAL ANCILLARY ONLY
Bacterial Vaginitis (gardnerella): NEGATIVE
Candida Glabrata: NEGATIVE
Candida Vaginitis: NEGATIVE
Chlamydia: NEGATIVE
Comment: NEGATIVE
Comment: NEGATIVE
Comment: NEGATIVE
Comment: NEGATIVE
Comment: NEGATIVE
Comment: NORMAL
Neisseria Gonorrhea: NEGATIVE
Trichomonas: NEGATIVE

## 2023-08-21 ENCOUNTER — Other Ambulatory Visit: Payer: Self-pay | Admitting: Family Medicine

## 2023-08-21 DIAGNOSIS — R41840 Attention and concentration deficit: Secondary | ICD-10-CM

## 2023-09-15 ENCOUNTER — Ambulatory Visit: Payer: Self-pay | Admitting: Family Medicine

## 2023-09-29 ENCOUNTER — Ambulatory Visit (INDEPENDENT_AMBULATORY_CARE_PROVIDER_SITE_OTHER): Payer: Self-pay | Admitting: Adult Health

## 2023-09-29 ENCOUNTER — Encounter: Payer: Self-pay | Admitting: Adult Health

## 2023-09-29 ENCOUNTER — Other Ambulatory Visit (HOSPITAL_COMMUNITY)
Admission: RE | Admit: 2023-09-29 | Discharge: 2023-09-29 | Disposition: A | Discharge status: Home / Self Care | Source: Ambulatory Visit | Attending: Adult Health | Admitting: Adult Health

## 2023-09-29 VITALS — BP 103/71 | HR 84 | Ht 65.0 in | Wt 158.0 lb

## 2023-09-29 DIAGNOSIS — Z3009 Encounter for other general counseling and advice on contraception: Secondary | ICD-10-CM | POA: Insufficient documentation

## 2023-09-29 DIAGNOSIS — Z113 Encounter for screening for infections with a predominantly sexual mode of transmission: Secondary | ICD-10-CM | POA: Insufficient documentation

## 2023-09-29 DIAGNOSIS — Z30011 Encounter for initial prescription of contraceptive pills: Secondary | ICD-10-CM | POA: Insufficient documentation

## 2023-09-29 DIAGNOSIS — N946 Dysmenorrhea, unspecified: Secondary | ICD-10-CM

## 2023-09-29 DIAGNOSIS — Z01419 Encounter for gynecological examination (general) (routine) without abnormal findings: Secondary | ICD-10-CM

## 2023-09-29 DIAGNOSIS — Z3202 Encounter for pregnancy test, result negative: Secondary | ICD-10-CM

## 2023-09-29 LAB — POCT URINE PREGNANCY: Preg Test, Ur: NEGATIVE

## 2023-09-29 MED ORDER — LO LOESTRIN FE 1 MG-10 MCG / 10 MCG PO TABS: 1.0000 | ORAL_TABLET | Freq: Every day | ORAL | 11 refills | Status: DC

## 2023-09-29 NOTE — Progress Notes (Signed)
 Patient ID: Christina Shah, female   DOB: [birth year], 28 y.o.   MRN: [id number] History of Present Illness: Christina Shah is a 28 year old black female,single, G0P0, in to discuss birth control and she has [facility], will do well woman gyn exam.     Component Value Date/Time   DIAGPAP  08/12/2022 1512    - Negative for intraepithelial lesion or malignancy (NILM)   HPVHIGH Negative 08/12/2022 1512   ADEQPAP  08/12/2022 1512    Satisfactory for evaluation; transformation zone component PRESENT.    PCP is G Zarwolo NP  Current Medications, Allergies, Past Medical History, Past Surgical History, Family History and Social History were reviewed in [employer] record.     Review of Systems: Patient denies any headaches, hearing loss, fatigue, blurred vision, shortness of breath, chest pain, abdominal pain, problems with bowel movements, urination, or intercourse. No joint pain or mood swings.  Has period cramps, and some acne Denies MI,stroke, DVT, breast cancer or migraine with aura   Physical Exam:BP 103/71 (BP Location: Right Arm, Patient Position: Sitting, Cuff Size: Normal)   Pulse 84   Ht 5' 5 (1.651 m)   Wt 158 lb (71.7 kg)   LMP 09/06/2023 (Exact Date)   BMI 26.29 kg/m  UPT is negative  General:  Well developed, well nourished, no acute distress Skin:  Warm and dry Neck:  Midline trachea, normal thyroid, good ROM, no lymphadenopathy Lungs; Clear to auscultation bilaterally Breast:  No dominant palpable mass, retraction, or nipple discharge Cardiovascular: Regular rate and rhythm Abdomen:  Soft, non tender, no hepatosplenomegaly Pelvic:  External genitalia is normal in appearance, no lesions. Has rod above clitoris area.  The vagina is normal in appearance, white discharge, no odor, CV swab obtained. Urethra has no lesions or masses. The cervix is smooth.  Uterus is felt to be normal size, shape, and contour.  No adnexal masses or tenderness  noted.Bladder is non tender, no masses felt. Extremities/musculoskeletal:  No swelling or varicosities noted, no clubbing or cyanosis Psych:  No mood changes, alert and cooperative,seems happy Fall  risk is low  Upstream - 09/29/23 0956       Pregnancy Intention Screening   Does the patient want to become pregnant in the next year? No    Does the patient's partner want to become pregnant in the next year? No    Would the patient like to discuss contraceptive options today? Yes      Contraception Wrap Up   Current Method Withdrawal or Other Method    End Method Oral Contraceptive    Contraception Counseling Provided Yes          Examination chaperoned by Clarita Salt LPN  Impression and plan: 1. Negative pregnancy test  - POCT urine pregnancy  2. Encounter for well woman exam with routine gynecological exam (Primary) Physical in 1 year  3. Screen for STD (sexually transmitted disease) CV swab sent for GC/CHL Will check HIV and RPR - Cervicovaginal ancillary only( [place]) - HIV Antibody (routine testing w rflx) - RPR  4. Family planning She wants to start on BCP Will rx lo loestrin    5. Encounter for initial prescription of contraceptive pills She wants the pill, will rx lo loestrin , can start on first day of period, and use condoms for 1 pack Meds ordered this encounter  Medications   Norethindrone-Ethinyl Estradiol-Fe Biphas (LO LOESTRIN FE ) 1 MG-10 MCG / 10 MCG tablet    Sig: Take 1  tablet by mouth daily. Take 1 daily by mouth    Dispense:  84 tablet    Refill:  11    BIN [id number], PCN [id number], GRP [id number],PI [id number]    Supervising Provider:   JAYNE MINDER H [[id number]]   Follow up in 3 months for ROS   6. Dysmenorrhea Will start on lo Loestrin 

## 2023-09-30 LAB — CERVICOVAGINAL ANCILLARY ONLY
Chlamydia: NEGATIVE
Comment: NEGATIVE
Comment: NORMAL
Neisseria Gonorrhea: NEGATIVE

## 2023-10-03 ENCOUNTER — Ambulatory Visit: Payer: Self-pay | Admitting: Adult Health

## 2023-10-31 ENCOUNTER — Other Ambulatory Visit: Payer: Self-pay | Admitting: Family Medicine

## 2023-10-31 DIAGNOSIS — R41840 Attention and concentration deficit: Secondary | ICD-10-CM

## 2023-11-14 ENCOUNTER — Ambulatory Visit
Admission: RE | Admit: 2023-11-14 | Discharge: 2023-11-14 | Disposition: A | Discharge status: Home / Self Care | Payer: Self-pay | Source: Ambulatory Visit | Attending: Nurse Practitioner | Admitting: Nurse Practitioner

## 2023-11-14 VITALS — BP 116/78 | HR 113 | Temp 98.8°F | Resp 20

## 2023-11-14 DIAGNOSIS — Z113 Encounter for screening for infections with a predominantly sexual mode of transmission: Secondary | ICD-10-CM | POA: Insufficient documentation

## 2023-11-14 NOTE — ED Triage Notes (Signed)
 Pt reports needing STD testing,pt  denies exposure to STD's

## 2023-11-14 NOTE — Discharge Instructions (Signed)
 Cytology swab is pending.  You will be contacted if the pending test results are positive.  You will also have access to the results via MyChart. Refrain from sexual intercourse until your test results are received. If your test results are positive, you will need to notify all partners. If treatment is recommended for positive test results, you will need to refrain from sexual intercourse for an additional 7 days after completing treatment. Follow-up with your primary care physician regarding birth control options. Follow-up as needed.

## 2023-11-14 NOTE — ED Provider Notes (Signed)
 [facility]    CSN: [id number] Arrival date & time: 11/14/23  1202      History   Chief Complaint Chief Complaint  Patient presents with   SEXUALLY TRANSMITTED DISEASE    Entered by patient    HPI Christina Shah is a 28 y.o. female.   The history is provided by the patient.    Patient presents for STI testing.  Patient denies fever, chills, vaginal discharge, vaginal odor, vaginal itching, or urinary symptoms.  Patient reports 1 female partner in the past 90 days.  Last menstrual cycle was 11/05/2023.  Patient reports she currently is not on birth control.  Past Medical History:  Diagnosis Date   Food allergy    Strep throat     Patient Active Problem List   Diagnosis Date Noted   Encounter for initial prescription of contraceptive pills 09/29/2023   Screen for STD (sexually transmitted disease) 09/29/2023   Encounter for well woman exam with routine gynecological exam 09/29/2023   Negative pregnancy test 09/29/2023   Family planning 09/29/2023   Dysmenorrhea 09/29/2023   Attention deficit 06/24/2023   Hirsutism 06/24/2023   Right lumbar pain 10/29/2022   Visual disturbance 06/22/2022   Muscle spasm 06/22/2022   Food allergy 08/21/2020   Birth control counseling 08/21/2020    Past Surgical History:  Procedure Laterality Date   WISDOM TOOTH EXTRACTION      OB History     Gravida  0   Para  0   Term  0   Preterm  0   AB  0   Living  0      SAB  0   IAB  0   Ectopic  0   Multiple  0   Live Births  0            Home Medications    Prior to Admission medications   Medication Sig Start Date End Date Taking? Authorizing Provider  albuterol  (VENTOLIN  HFA) 108 (90 Base) MCG/ACT inhaler Inhale 1-2 puffs into the lungs every 4 (four) hours as needed for wheezing or shortness of breath. 08/25/22   Bacchus, Meade PEDLAR, FNP  buPROPion  (WELLBUTRIN  XL) 150 MG 24 hr tablet TAKE 1 TABLET(150 MG) BY MOUTH DAILY 10/31/23   Bacchus, Gloria  Z, FNP  Cholecalciferol (VITAMIN D -3 PO) Take by mouth.    [provider]  Ferrous Sulfate (IRON PO) Take by mouth.    [provider]  Norethindrone-Ethinyl Estradiol-Fe Biphas (LO LOESTRIN FE ) 1 MG-10 MCG / 10 MCG tablet Take 1 tablet by mouth daily. Take 1 daily by mouth 09/29/23   Signa Delon LABOR, NP    Family History Family History  Problem Relation Age of Onset   Diabetes Maternal Grandmother    Cancer Father    Hypertension Father    Hypertension Mother     Social History Social History   Tobacco Use   Smoking status: Never   Smokeless tobacco: Never  Vaping Use   Vaping status: Never Used  Substance Use Topics   Alcohol use: Yes    Alcohol/week: 2.0 standard drinks of alcohol    Types: 2 Glasses of wine per week    Comment: occ   Drug use: No     Allergies   Pineapple, Other, Peanuts [peanut oil], and Peanut-containing drug products   Review of Systems Review of Systems Per HPI  Physical Exam Triage Vital Signs ED Triage Vitals  Encounter Vitals Group  BP 11/14/23 1213 116/78     Girls Systolic BP Percentile --      Girls Diastolic BP Percentile --      Boys Systolic BP Percentile --      Boys Diastolic BP Percentile --      Pulse Rate 11/14/23 1213 (!) 113     Resp 11/14/23 1213 20     Temp 11/14/23 1213 98.8 F (37.1 C)     Temp src --      SpO2 11/14/23 1213 98 %     Weight --      Height --      Head Circumference --      Peak Flow --      Pain Score 11/14/23 1215 0     Pain Loc --      Pain Education --      Exclude from Growth Chart --    No data found.  Updated Vital Signs BP 116/78 (BP Location: Right Arm)   Pulse (!) 113   Temp 98.8 F (37.1 C)   Resp 20   LMP 11/05/2023 (Exact Date)   SpO2 98%   Visual Acuity Right Eye Distance:   Left Eye Distance:   Bilateral Distance:    Right Eye Near:   Left Eye Near:    Bilateral Near:     Physical Exam Vitals and nursing note reviewed.   Constitutional:      General: She is not in acute distress.    Appearance: Normal appearance.  HENT:     Head: Normocephalic.  Eyes:     Extraocular Movements: Extraocular movements intact.     Pupils: Pupils are equal, round, and reactive to light.  Cardiovascular:     Rate and Rhythm: Normal rate and regular rhythm.     Pulses: Normal pulses.     Heart sounds: Normal heart sounds.  Pulmonary:     Effort: Pulmonary effort is normal. No respiratory distress.     Breath sounds: Normal breath sounds. No stridor. No wheezing, rhonchi or rales.  Abdominal:     General: Bowel sounds are normal.     Palpations: Abdomen is soft.  Genitourinary:    Comments: GU exam deferred, self swab performed  Musculoskeletal:     Cervical back: Normal range of motion.  Neurological:     General: No focal deficit present.     Mental Status: She is alert and oriented to person, place, and time.  Psychiatric:        Mood and Affect: Mood normal.        Behavior: Behavior normal.      UC Treatments / Results  Labs (all labs ordered are listed, but only abnormal results are displayed) Labs Reviewed - No data to display  EKG   Radiology No results found.  Procedures Procedures (including critical care time)  Medications Ordered in UC Medications - No data to display  Initial Impression / Assessment and Plan / UC Course  I have reviewed the triage vital signs and the nursing notes.  Pertinent labs & imaging results that were available during my care of the patient were reviewed by me and considered in my medical decision making (see chart for details).  Cytology swab is pending.  Patient has declined testing for HIV or syphilis today.  Supportive care recommendations were provided and discussed with the patient to include refraining from sexual intercourse until her just results are received, notifying all partners if her test results are positive,  and refraining from sexual intercourse  for an additional 7 days after treatment is completed if indicated.  Patient was in agreement with this plan of care and verbalizes understanding.  All questions were answered.  Patient stable for discharge.  Final Clinical Impressions(s) / UC Diagnoses   Final diagnoses:  Screening for STD (sexually transmitted disease)     Discharge Instructions      Cytology swab is pending.  You will be contacted if the pending test results are positive.  You will also have access to the results via MyChart. Refrain from sexual intercourse until your test results are received. If your test results are positive, you will need to notify all partners. If treatment is recommended for positive test results, you will need to refrain from sexual intercourse for an additional 7 days after completing treatment. Follow-up with your primary care physician regarding birth control options. Follow-up as needed.     ED Prescriptions   None    PDMP not reviewed this encounter.   Gilmer Etta PARAS, NP 11/14/23 1229

## 2023-11-15 LAB — CERVICOVAGINAL ANCILLARY ONLY
Chlamydia: NEGATIVE
Comment: NEGATIVE
Comment: NEGATIVE
Comment: NORMAL
Neisseria Gonorrhea: NEGATIVE
Trichomonas: NEGATIVE

## 2023-12-29 ENCOUNTER — Ambulatory Visit: Payer: Self-pay | Admitting: Adult Health

## 2023-12-29 ENCOUNTER — Encounter: Payer: Self-pay | Admitting: Adult Health

## 2023-12-29 VITALS — BP 117/75 | HR 88 | Ht 65.0 in | Wt 158.5 lb

## 2023-12-29 DIAGNOSIS — Z30011 Encounter for initial prescription of contraceptive pills: Secondary | ICD-10-CM

## 2023-12-29 DIAGNOSIS — Z3202 Encounter for pregnancy test, result negative: Secondary | ICD-10-CM

## 2023-12-29 LAB — POCT URINE PREGNANCY: Preg Test, Ur: NEGATIVE

## 2023-12-29 MED ORDER — NORETHIN ACE-ETH ESTRAD-FE 1-20 MG-MCG PO TABS: 1.0000 | ORAL_TABLET | Freq: Every day | ORAL | 11 refills | Status: AC

## 2023-12-29 NOTE — Progress Notes (Signed)
°  Subjective:     Patient ID: Christina Shah, female   DOB: 1995/02/21, 28 y.o.   MRN: [id number]  HPI Christina Shah is a 28 year old black female, single, G0P0, in to get on a cheap BCP, never started lo Loestrin  was too expensive.     Component Value Date/Time   DIAGPAP  08/12/2022 1512    - Negative for intraepithelial lesion or malignancy (NILM)   HPVHIGH Negative 08/12/2022 1512   ADEQPAP  08/12/2022 1512    Satisfactory for evaluation; transformation zone component PRESENT.    PCP is Meade Gerlach NP Review of Systems Had period in November Denies MI,stroke, DVT, breast cancer or migraine with aura Reviewed past medical,surgical, social and family history. Reviewed medications and allergies.     Objective:   Physical Exam BP 117/75 (BP Location: Right Arm, Patient Position: Sitting, Cuff Size: Normal)   Pulse 88   Ht 5' 5 (1.651 m)   Wt 158 lb 8 oz (71.9 kg)   LMP 12/02/2023 (Exact Date)   BMI 26.38 kg/m  UPT is negative  Skin warm and dry. Lungs: clear to ausculation bilaterally. Cardiovascular: regular rate and rhythm.   Upstream - 12/29/23 0956       Pregnancy Intention Screening   Does the patient want to become pregnant in the next year? No    Does the patient's partner want to become pregnant in the next year? No    Would the patient like to discuss contraceptive options today? Yes      Contraception Wrap Up   Current Method Female Condom;Withdrawal or Other Method    End Method Oral Contraceptive    Contraception Counseling Provided Yes             Assessment:     1. Negative pregnancy test - POCT urine pregnancy  2. Encounter for initial prescription of contraceptive pills (Primary) Will rx junel 1/20, start with next period and use condoms for 1 pack Meds ordered this encounter  Medications   norethindrone-ethinyl estradiol-FE (LOESTRIN  FE) 1-20 MG-MCG tablet    Sig: Take 1 tablet by mouth daily.    Dispense:  28 tablet    Refill:  11     Supervising Provider:   JAYNE MINDER H [[id number]]       Gave [facility] card Plan:     Follow up in 3 months for ROS

## 2024-01-06 ENCOUNTER — Telehealth (INDEPENDENT_AMBULATORY_CARE_PROVIDER_SITE_OTHER): Payer: Self-pay | Admitting: Family Medicine

## 2024-01-06 VITALS — Ht 65.0 in | Wt 156.0 lb

## 2024-01-06 DIAGNOSIS — R41840 Attention and concentration deficit: Secondary | ICD-10-CM

## 2024-01-06 NOTE — Progress Notes (Signed)
" ° °  Virtual Visit via Video Note  I connected with Christina Shah on 01/06/2024 at  8:20 AM EST by a video enabled telemedicine application and verified that I am speaking with the correct person using two identifiers.  Patient Location: Home Provider Location: Home Office  I discussed the limitations, risks, security, and privacy concerns of performing an evaluation and management service by video and the availability of in person appointments. I also discussed with the patient that there may be a patient responsible charge related to this service. The patient expressed understanding and agreed to proceed.  Subjective: PCP: Edman Meade PEDLAR, FNP  Chief Complaint  Patient presents with   Medical Management of Chronic Issues    6 month follow up.    HPI The patient is seen today for a 5-month follow-up and reports taking Wellbutrin  for six months with minimal relief of her attention deficit symptoms; therefore, she discontinued the medication. She reports contacting neuropsychiatric providers but did not receive a call back. No other complaints were noted.   ROS: Per HPI Current Medications[1]  Observations/Objective: Today's Vitals   01/06/24 0814  Weight: 156 lb (70.8 kg)  Height: 5' 5 (1.651 m)   Physical Exam Patient is well-developed, well-nourished in no acute distress.  Resting comfortably at home.  Head is normocephalic, atraumatic.  No labored breathing.  Speech is clear and coherent with logical content.  Patient is alert and oriented at baseline.   Assessment and Plan: Attention deficit   Encouraged to contact [place] attention specialist for ADHD testing Continue all treatment regimen as is Follow Up Instructions: Return in about 6 months (around 07/06/2024).   I discussed the assessment and treatment plan with the patient. The patient was provided an opportunity to ask questions, and all were answered. The patient agreed with the plan and demonstrated an  understanding of the instructions.   The patient was advised to call back or seek an in-person evaluation if the symptoms worsen or if the condition fails to improve as anticipated.  The above assessment and management plan was discussed with the patient. The patient verbalized understanding of and has agreed to the management plan.   Cyndi Montejano  Z Bacchus, FNP    [1]  Current Outpatient Medications:    albuterol  (VENTOLIN  HFA) 108 (90 Base) MCG/ACT inhaler, Inhale 1-2 puffs into the lungs every 4 (four) hours as needed for wheezing or shortness of breath. (Patient not taking: Reported on 01/06/2024), Disp: 1 each, Rfl: 1   Cholecalciferol (VITAMIN D -3 PO), Take by mouth. (Patient not taking: Reported on 01/06/2024), Disp: , Rfl:    Ferrous Sulfate (IRON PO), Take by mouth. (Patient not taking: Reported on 01/06/2024), Disp: , Rfl:    norethindrone-ethinyl estradiol-FE (LOESTRIN  FE) 1-20 MG-MCG tablet, Take 1 tablet by mouth daily. (Patient not taking: Reported on 01/06/2024), Disp: 28 tablet, Rfl: 11  "

## 2024-02-16 ENCOUNTER — Ambulatory Visit: Payer: Self-pay

## 2024-02-16 DIAGNOSIS — Z113 Encounter for screening for infections with a predominantly sexual mode of transmission: Secondary | ICD-10-CM

## 2024-02-16 DIAGNOSIS — N898 Other specified noninflammatory disorders of vagina: Secondary | ICD-10-CM

## 2024-02-16 NOTE — Patient Instructions (Signed)
Recurrent Vaginitis Alternative Therapies  Both options are to be done after sexual intercourse, your period ends, and when you think you may have bacterial vaginosis (BV) or a yeast infection.  If symptoms persist you will need to schedule an appointment to be seen  1) Soak in tub of waist- high warm water with 1/2 cup of baking soda for at least 20 mins.  2) Soak 3 tampons in 1 tablespoon of fractionated (liquid form) coconut oil with 10 drops of Melaleuca (Tea Tree) essential oil, insert 1 saturated tampon vaginally at bedtime x 3 days.    You can purchase Melaleuca/Tea Tree oil online at amazon.com, with a [employer] or [employer] representative, or locally at:  [employer] [address] [place], [place] [address] [phone]  [employer] [address] [place], [place] [address] [phone]  You may also want to consider making the changes below:  . Soap: Unscented Dove (white box light green writing)  . Wash cloth: use a separate white washcloth for your genital area . Laundry detergent: Dreft or unscented Arm n' Hammer  . Underwear: White 100% cotton panties (NOT just cotton crouch) . Sanitary pads/tampons: Unscented only-If it doesn't SAY unscented it can have a scent/perfume    . NO PERFUMES OR LOTIONS OR POTIONS in the genital area (may use regular KY) . Condoms: hypoallergenic only, non-dyed (no color) . Toilet paper: White unscented only   

## 2024-02-16 NOTE — Progress Notes (Signed)
" ° °  NURSE VISIT- VAGINITIS  SUBJECTIVE:  Christina Shah is a 29 y.o. G0P0000 GYN patientfemale here for a vaginal swab for vaginitis screening.  She reports the following symptoms: discharge described as milky for every time after her period. Denies abnormal vaginal bleeding, significant pelvic pain, fever, or UTI symptoms.  OBJECTIVE:  There were no vitals taken for this visit.  Appears well, in no apparent distress  ASSESSMENT: Vaginal swab for vaginitis screening  PLAN: Self-collected vaginal probe for Gonorrhea, Chlamydia, Trichomonas, Bacterial Vaginosis, Yeast sent to lab Treatment: to be determined once results are received Follow-up as needed if symptoms persist/worsen, or new symptoms develop  Aleck FORBES Blase  02/16/2024 2:36 PM  "
